# Patient Record
Sex: Male | Born: 1960 | Race: Black or African American | Hispanic: No | Marital: Married | Smoking: Never smoker
Health system: Southern US, Community
[De-identification: ages and names within clinical notes are randomized; demographics above are authoritative.]

## PROBLEM LIST (undated history)

## (undated) DIAGNOSIS — F419 Anxiety disorder, unspecified: Secondary | ICD-10-CM

## (undated) DIAGNOSIS — I10 Essential (primary) hypertension: Secondary | ICD-10-CM

## (undated) DIAGNOSIS — H269 Unspecified cataract: Secondary | ICD-10-CM

## (undated) DIAGNOSIS — J449 Chronic obstructive pulmonary disease, unspecified: Secondary | ICD-10-CM

## (undated) DIAGNOSIS — G4733 Obstructive sleep apnea (adult) (pediatric): Secondary | ICD-10-CM

## (undated) DIAGNOSIS — K219 Gastro-esophageal reflux disease without esophagitis: Secondary | ICD-10-CM

## (undated) DIAGNOSIS — Z9989 Dependence on other enabling machines and devices: Principal | ICD-10-CM

## (undated) DIAGNOSIS — E119 Type 2 diabetes mellitus without complications: Secondary | ICD-10-CM

## (undated) DIAGNOSIS — G4730 Sleep apnea, unspecified: Secondary | ICD-10-CM

## (undated) DIAGNOSIS — E785 Hyperlipidemia, unspecified: Secondary | ICD-10-CM

## (undated) HISTORY — DX: Hyperlipidemia, unspecified: E78.5

## (undated) HISTORY — DX: Essential (primary) hypertension: I10

## (undated) HISTORY — PX: CARPAL TUNNEL RELEASE: SHX101

## (undated) HISTORY — DX: Gastro-esophageal reflux disease without esophagitis: K21.9

## (undated) HISTORY — PX: HERNIA REPAIR: SHX51

## (undated) HISTORY — DX: Dependence on other enabling machines and devices: Z99.89

## (undated) HISTORY — DX: Obstructive sleep apnea (adult) (pediatric): G47.33

## (undated) HISTORY — DX: Unspecified cataract: H26.9

## (undated) HISTORY — DX: Chronic obstructive pulmonary disease, unspecified: J44.9

## (undated) HISTORY — DX: Sleep apnea, unspecified: G47.30

## (undated) HISTORY — DX: Anxiety disorder, unspecified: F41.9

## (undated) HISTORY — DX: Type 2 diabetes mellitus without complications: E11.9

---

## 1993-01-15 HISTORY — PX: REPAIR ANKLE LIGAMENT: SUR1187

## 2006-06-09 ENCOUNTER — Emergency Department (HOSPITAL_COMMUNITY): Admission: EM | Admit: 2006-06-09 | Discharge: 2006-06-09 | Payer: Self-pay | Admitting: Emergency Medicine

## 2006-07-08 ENCOUNTER — Ambulatory Visit: Payer: Self-pay | Admitting: Internal Medicine

## 2006-07-16 ENCOUNTER — Ambulatory Visit (HOSPITAL_COMMUNITY): Admission: RE | Admit: 2006-07-16 | Discharge: 2006-07-16 | Payer: Self-pay | Admitting: Internal Medicine

## 2006-08-22 ENCOUNTER — Ambulatory Visit: Payer: Self-pay | Admitting: *Deleted

## 2006-08-22 ENCOUNTER — Ambulatory Visit: Payer: Self-pay | Admitting: Internal Medicine

## 2006-09-18 ENCOUNTER — Ambulatory Visit: Payer: Self-pay | Admitting: Internal Medicine

## 2006-09-19 ENCOUNTER — Ambulatory Visit (HOSPITAL_COMMUNITY): Admission: RE | Admit: 2006-09-19 | Discharge: 2006-09-19 | Payer: Self-pay | Admitting: Internal Medicine

## 2006-10-02 ENCOUNTER — Emergency Department (HOSPITAL_COMMUNITY): Admission: EM | Admit: 2006-10-02 | Discharge: 2006-10-02 | Payer: Self-pay | Admitting: Emergency Medicine

## 2006-10-24 ENCOUNTER — Ambulatory Visit (HOSPITAL_BASED_OUTPATIENT_CLINIC_OR_DEPARTMENT_OTHER): Admission: RE | Admit: 2006-10-24 | Discharge: 2006-10-24 | Payer: Self-pay | Admitting: Internal Medicine

## 2006-10-25 ENCOUNTER — Ambulatory Visit: Payer: Self-pay | Admitting: Internal Medicine

## 2006-12-04 ENCOUNTER — Ambulatory Visit: Payer: Self-pay | Admitting: Internal Medicine

## 2006-12-20 ENCOUNTER — Ambulatory Visit: Payer: Self-pay | Admitting: Internal Medicine

## 2007-01-23 ENCOUNTER — Ambulatory Visit (HOSPITAL_BASED_OUTPATIENT_CLINIC_OR_DEPARTMENT_OTHER): Admission: RE | Admit: 2007-01-23 | Discharge: 2007-01-23 | Payer: Self-pay | Admitting: Internal Medicine

## 2007-01-23 ENCOUNTER — Ambulatory Visit: Payer: Self-pay | Admitting: Internal Medicine

## 2007-01-25 ENCOUNTER — Ambulatory Visit: Payer: Self-pay | Admitting: Internal Medicine

## 2007-02-01 ENCOUNTER — Emergency Department (HOSPITAL_COMMUNITY): Admission: EM | Admit: 2007-02-01 | Discharge: 2007-02-01 | Payer: Self-pay | Admitting: Emergency Medicine

## 2007-03-05 ENCOUNTER — Ambulatory Visit: Payer: Self-pay | Admitting: Internal Medicine

## 2007-04-23 ENCOUNTER — Ambulatory Visit: Payer: Self-pay | Admitting: Internal Medicine

## 2007-06-17 ENCOUNTER — Emergency Department (HOSPITAL_COMMUNITY): Admission: EM | Admit: 2007-06-17 | Discharge: 2007-06-17 | Payer: Self-pay | Admitting: Emergency Medicine

## 2007-06-26 ENCOUNTER — Ambulatory Visit: Payer: Self-pay | Admitting: Internal Medicine

## 2007-08-16 ENCOUNTER — Emergency Department (HOSPITAL_COMMUNITY): Admission: EM | Admit: 2007-08-16 | Discharge: 2007-08-16 | Payer: Self-pay | Admitting: Emergency Medicine

## 2007-09-10 ENCOUNTER — Ambulatory Visit: Payer: Self-pay | Admitting: Internal Medicine

## 2007-10-27 ENCOUNTER — Emergency Department (HOSPITAL_COMMUNITY): Admission: EM | Admit: 2007-10-27 | Discharge: 2007-10-27 | Payer: Self-pay | Admitting: Emergency Medicine

## 2007-11-26 ENCOUNTER — Ambulatory Visit: Payer: Self-pay | Admitting: Internal Medicine

## 2008-01-21 ENCOUNTER — Ambulatory Visit: Payer: Self-pay | Admitting: Internal Medicine

## 2008-04-22 ENCOUNTER — Ambulatory Visit: Payer: Self-pay | Admitting: Internal Medicine

## 2008-07-22 ENCOUNTER — Ambulatory Visit: Payer: Self-pay | Admitting: Internal Medicine

## 2008-07-22 LAB — CONVERTED CEMR LAB
ALT: 51 units/L (ref 0–53)
AST: 24 units/L (ref 0–37)
Chloride: 100 meq/L (ref 96–112)
Cholesterol: 181 mg/dL (ref 0–200)
Creatinine, Ser: 0.89 mg/dL (ref 0.40–1.50)
HDL: 66 mg/dL (ref >39)
LDL Cholesterol: 100 mg/dL — ABNORMAL HIGH (ref 0–99)
TSH: 0.977 microintl units/mL (ref 0.350–4.500)
Total Bilirubin: 1.4 mg/dL — ABNORMAL HIGH (ref 0.3–1.2)
Total CHOL/HDL Ratio: 2.7
Total Protein: 7.6 g/dL (ref 6.0–8.3)
Triglycerides: 75 mg/dL (ref <150)
VLDL: 15 mg/dL (ref 0–40)

## 2008-08-19 ENCOUNTER — Encounter: Admission: RE | Admit: 2008-08-19 | Discharge: 2008-11-17 | Payer: Self-pay | Admitting: Internal Medicine

## 2008-10-21 ENCOUNTER — Ambulatory Visit: Payer: Self-pay | Admitting: Internal Medicine

## 2009-01-19 ENCOUNTER — Ambulatory Visit: Payer: Self-pay | Admitting: Internal Medicine

## 2009-01-28 IMAGING — CR DG FEET 3 VIEWS BILAT
6 series · 6 of 6 positions shown · non-contrast
Comparison: ankle films 06/09/06.

CLINICAL DATA: Clinical Data:    Hand pain with limited gripping for two months.  Bilateral foot pain.  Right lower leg tendon surgical repair in 5717. 
RIGHT HAND ? 3 VIEWS:
No comparison.

[t foot ap left]
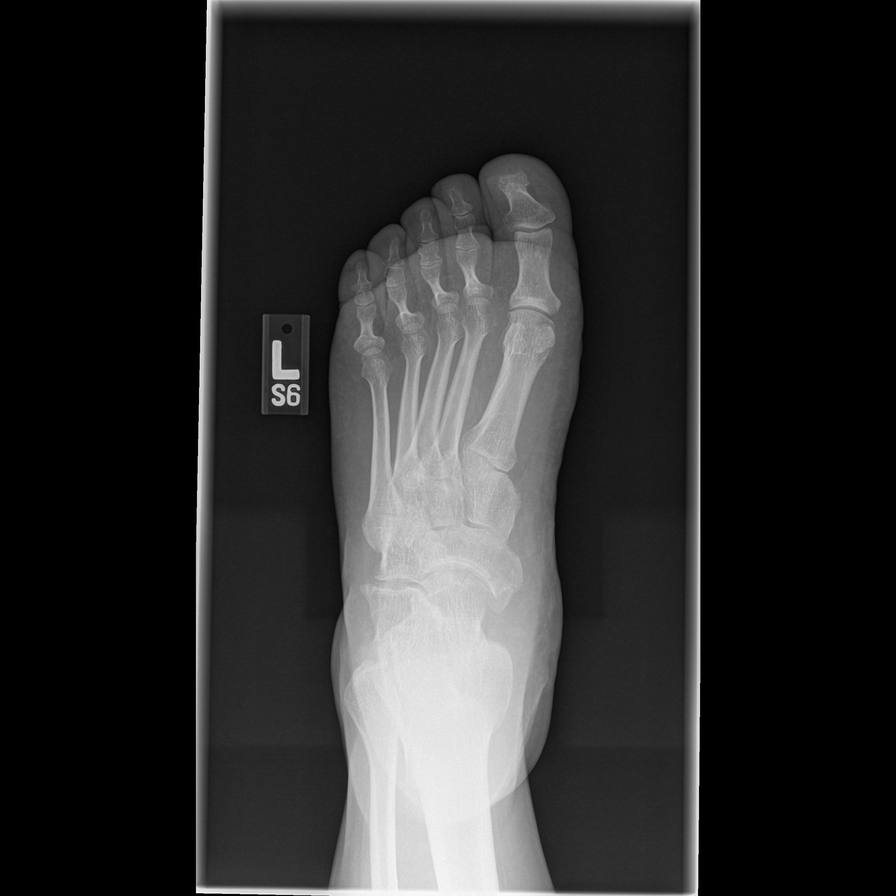

[t foot oblique left]
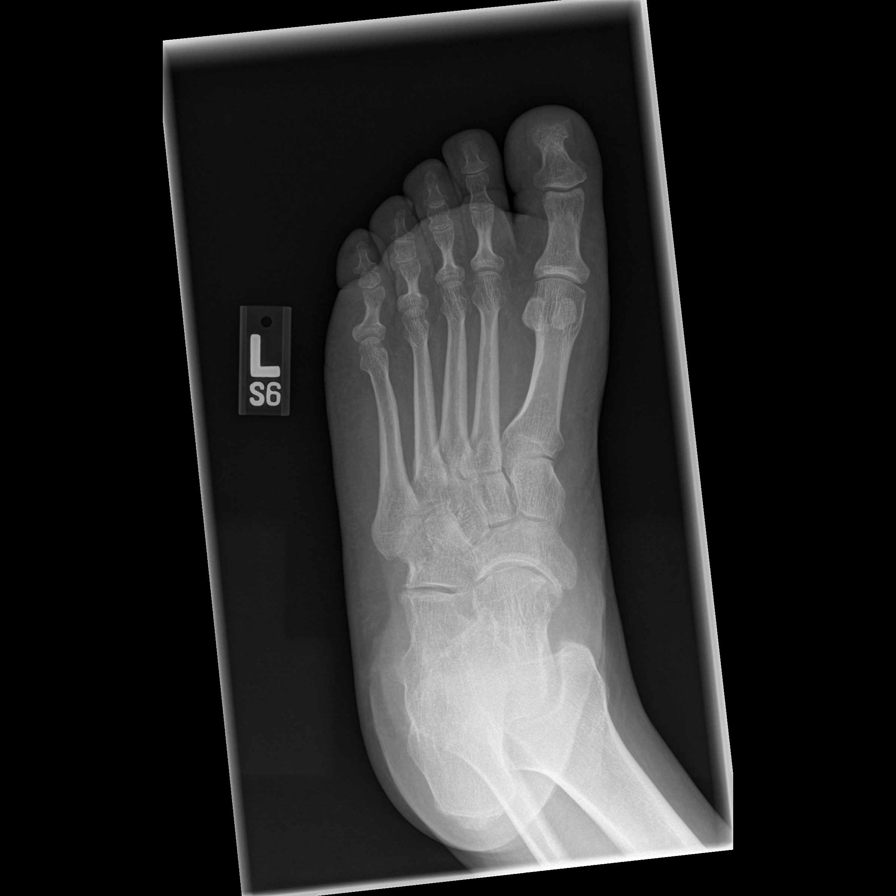

[t foot lat left]
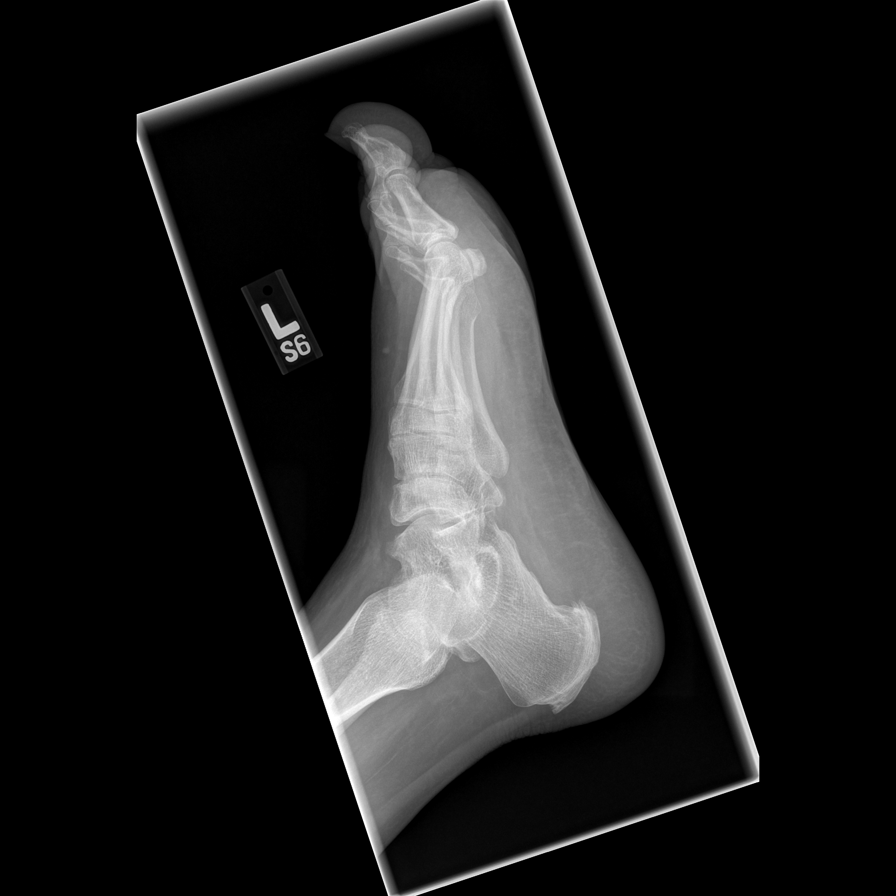

[t foot ap right]
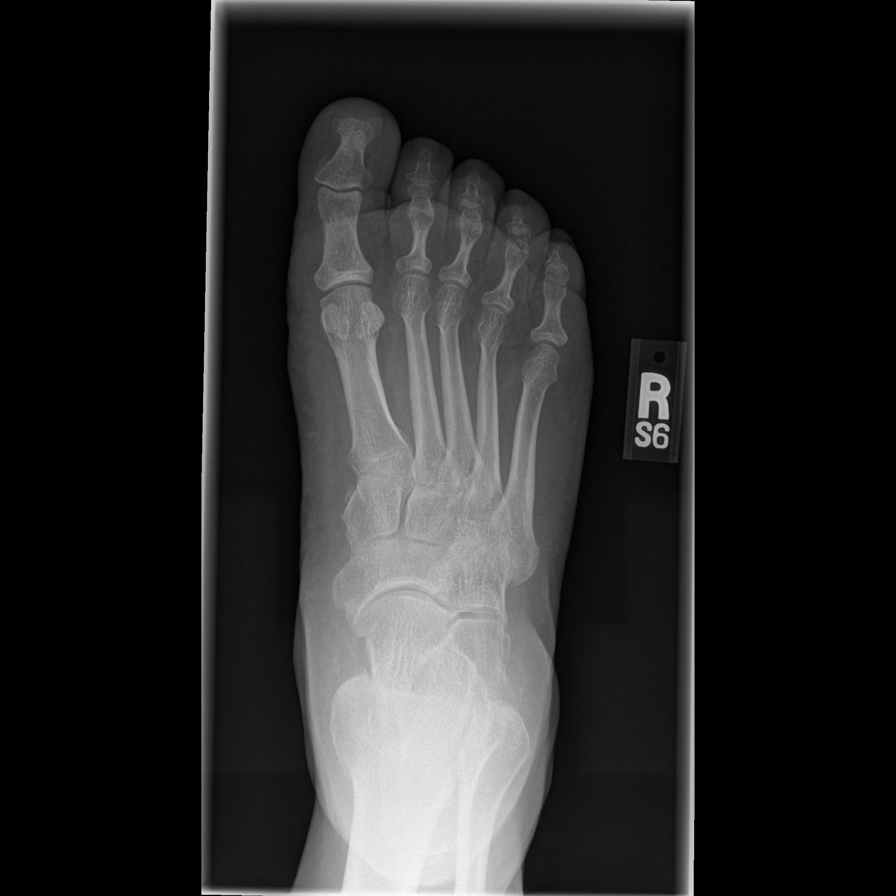

[t foot oblique right]
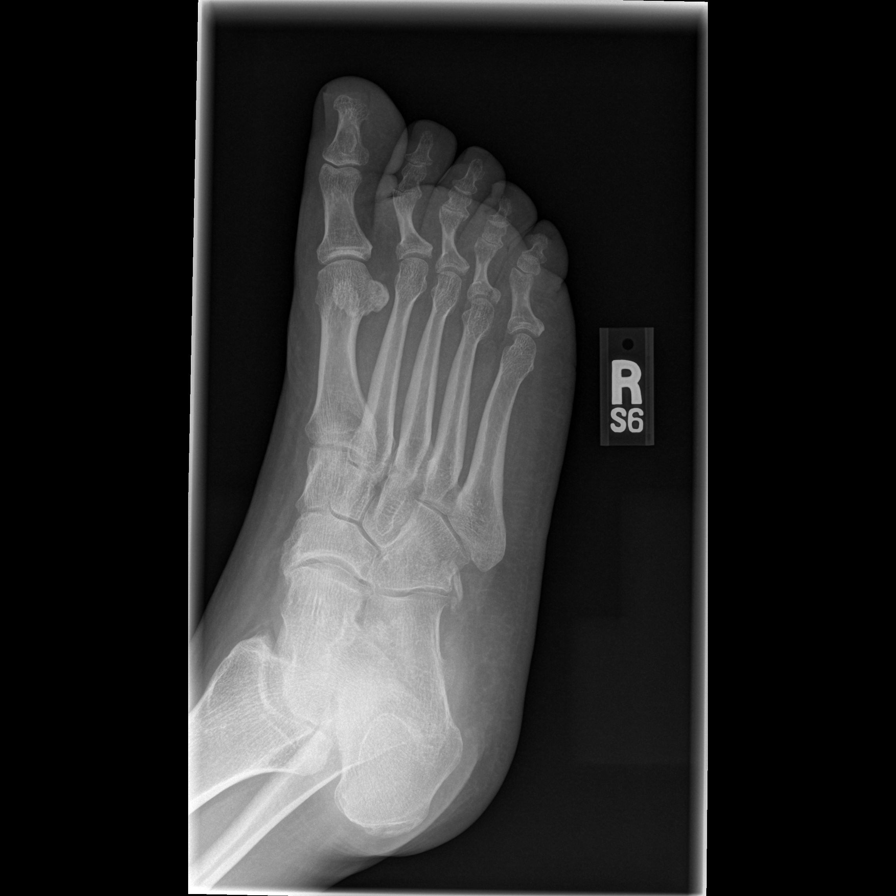

[t foot lat right]
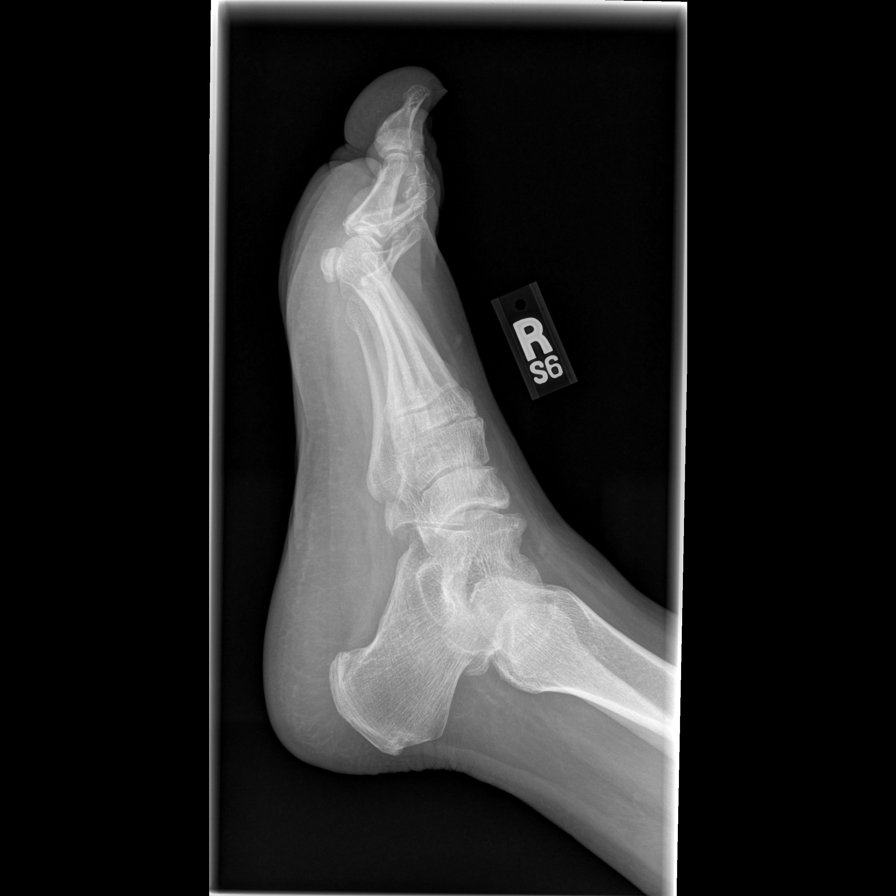

[6 of 6 positions shown; findings below may reference images not displayed]

FINDINGS: There is no evidence of fracture or dislocation. No other soft tissue or bone abnormalities are identified.
IMPRESSION: Negative.
LEFT HAND ? 3 VIEWS:
FINDINGS: There is no evidence of fracture or dislocation. No other soft tissue or bone abnormalities are identified.
IMPRESSION: Negative. 
LEFT FOOT ? 3 VIEWS:
FINDINGS: The soft tissues are generally prominent.  No focal swelling is evident.  There is no evidence of acute fracture or dislocation.  Calcaneocuboid degenerative changes are present with prominent spurring. There are small calcaneal spurs.
IMPRESSION: No acute osseous findings.  Asymmetric left foot calcaneocuboid degenerative change.
RIGHT FOOT ? 3 VIEWS:
FINDINGS: Soft tissue prominence appears diffuse and symmetric with respect to the left foot.  There is no evidence of acute fracture or dislocation.  Mild degenerative spurring and small calcaneal spurs appear stable.
IMPRESSION: No acute findings.

## 2009-01-28 IMAGING — CR DG HAND 3V BILAT
6 series · 6 of 6 positions shown · non-contrast
Comparison: ankle films 06/09/06.

CLINICAL DATA: Clinical Data:    Hand pain with limited gripping for two months.  Bilateral foot pain.  Right lower leg tendon surgical repair in 5717. 
RIGHT HAND ? 3 VIEWS:
No comparison.

[x hand pa left]
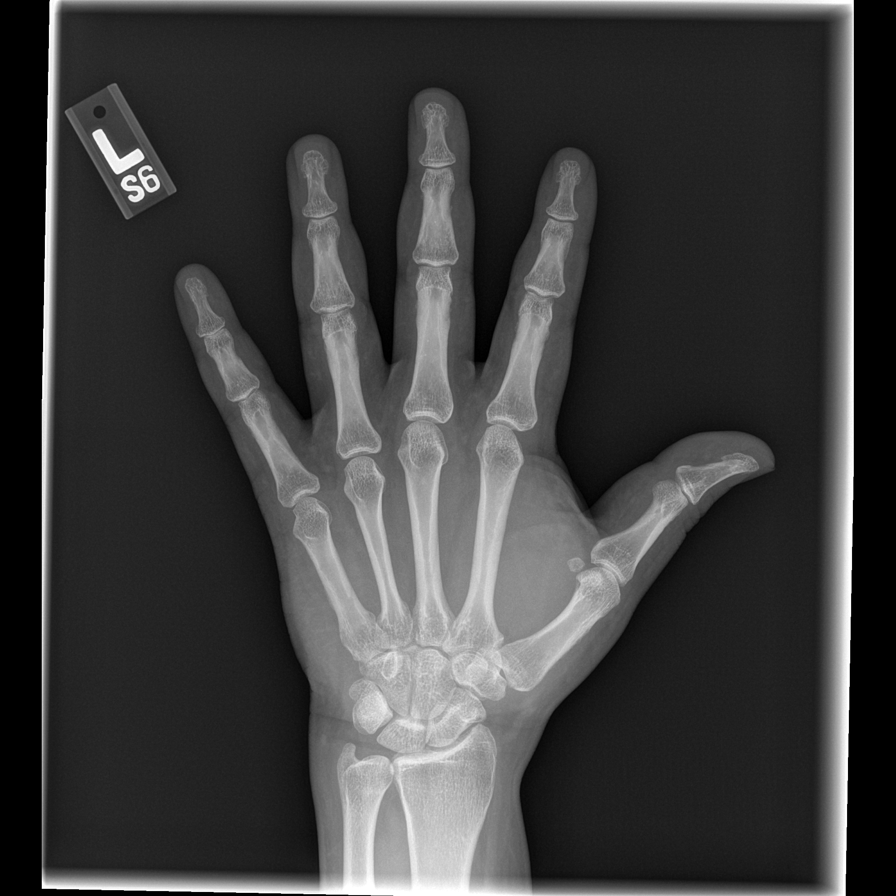

[x hand oblique left]
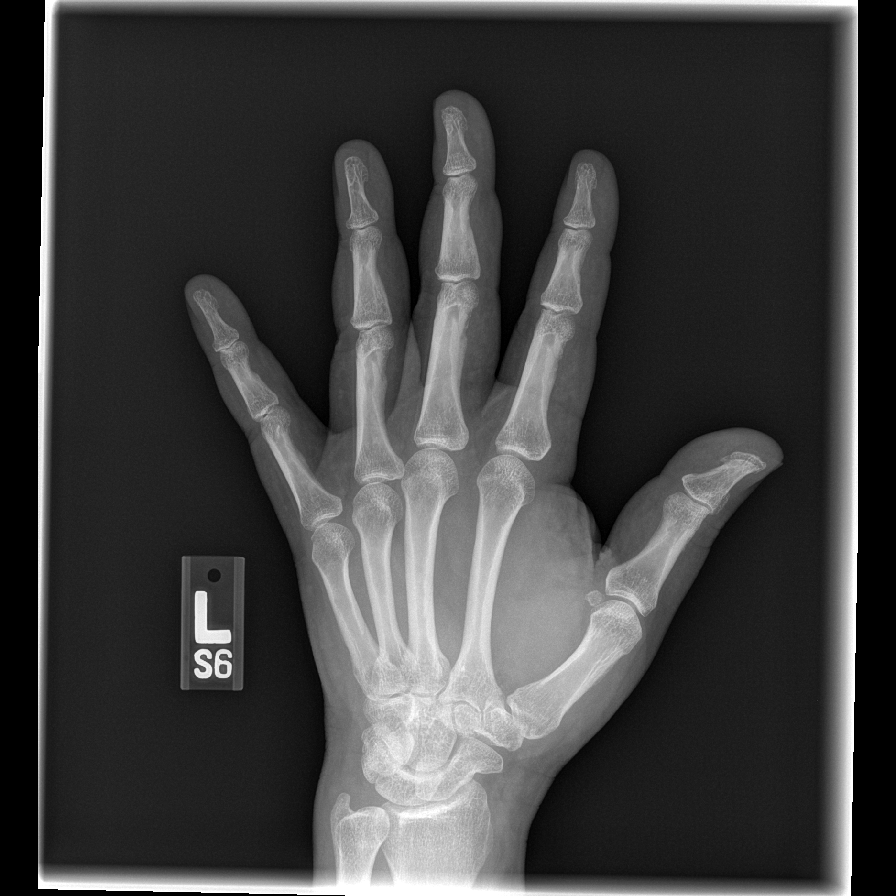

[x hand lat left]
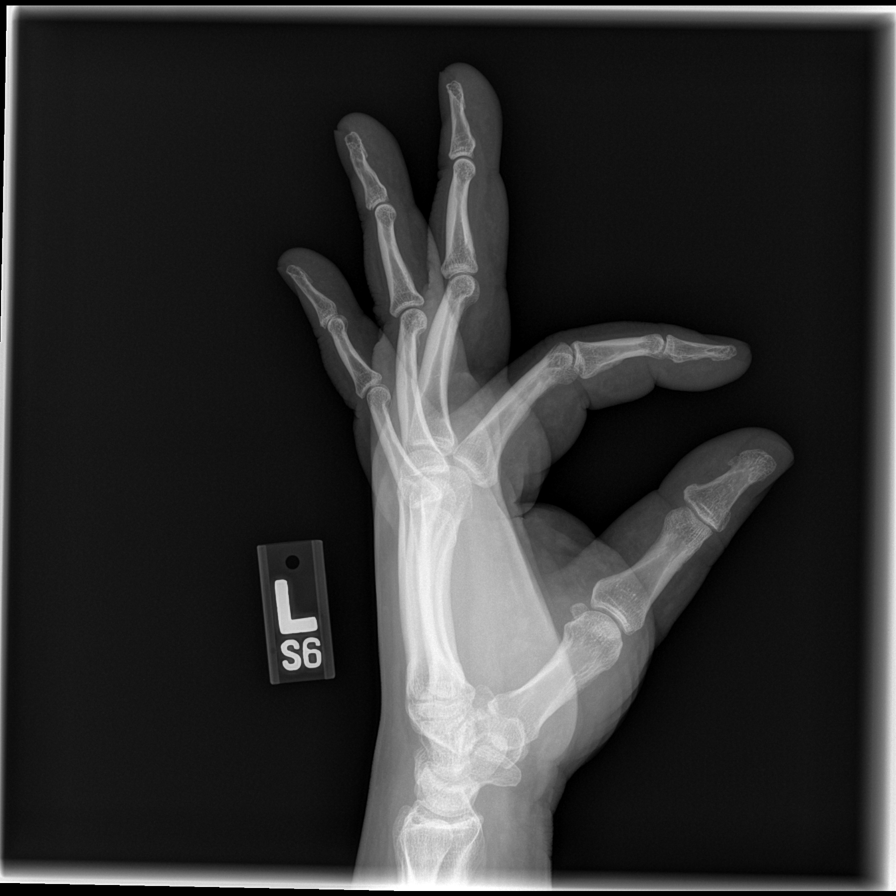

[x hand pa right]
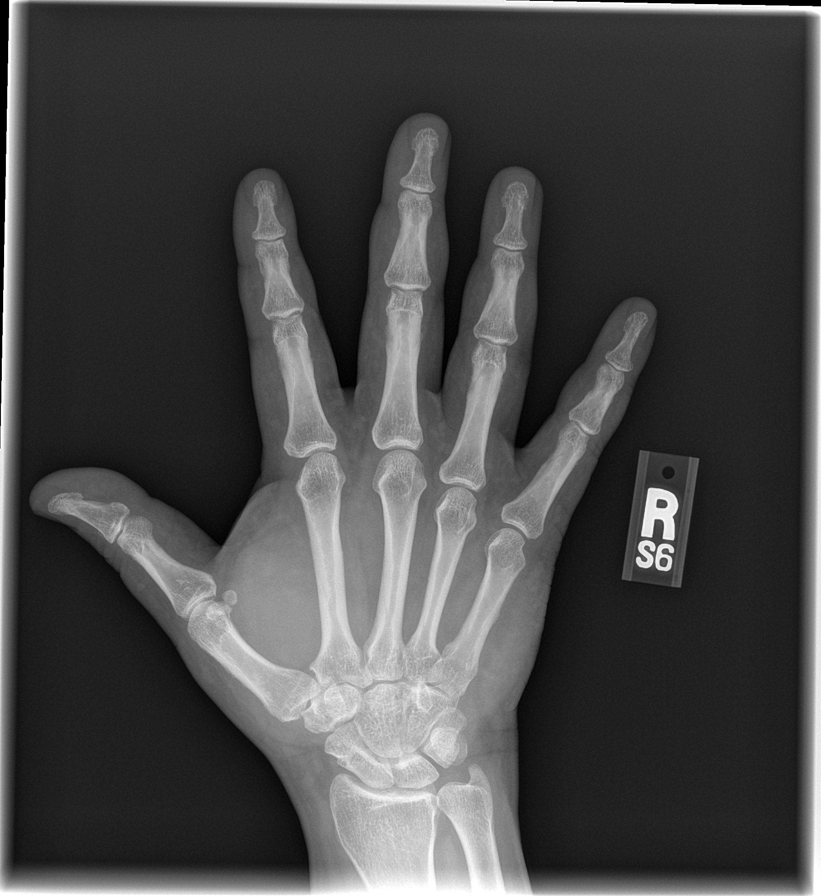

[x hand oblique right]
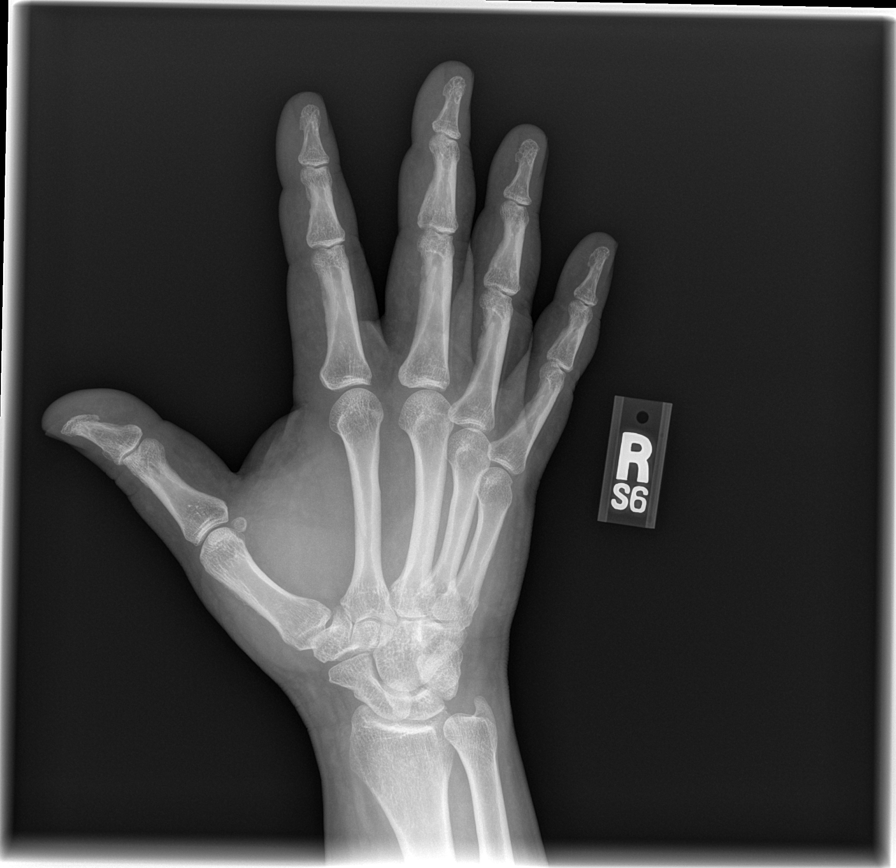

[x hand lat right]
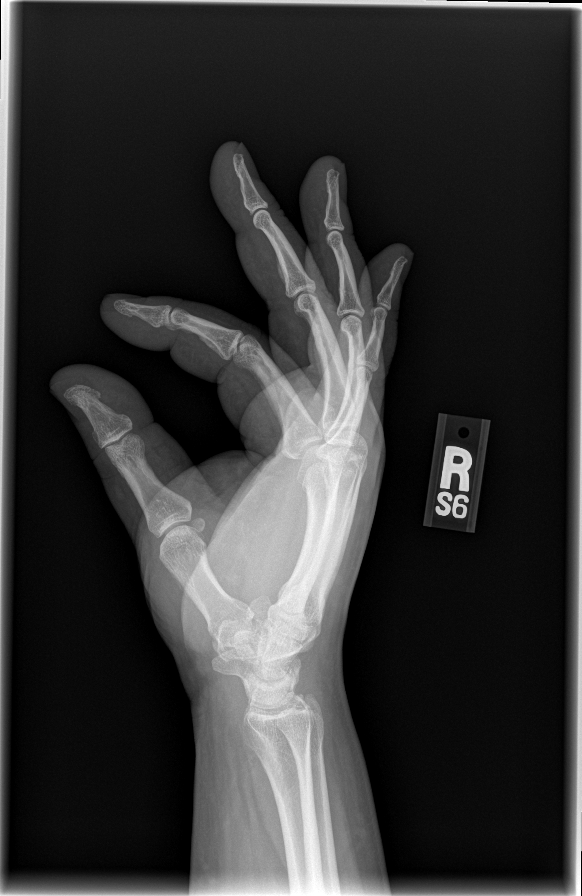

[6 of 6 positions shown; findings below may reference images not displayed]

FINDINGS: There is no evidence of fracture or dislocation. No other soft tissue or bone abnormalities are identified.
IMPRESSION: Negative.
LEFT HAND ? 3 VIEWS:
FINDINGS: There is no evidence of fracture or dislocation. No other soft tissue or bone abnormalities are identified.
IMPRESSION: Negative. 
LEFT FOOT ? 3 VIEWS:
FINDINGS: The soft tissues are generally prominent.  No focal swelling is evident.  There is no evidence of acute fracture or dislocation.  Calcaneocuboid degenerative changes are present with prominent spurring. There are small calcaneal spurs.
IMPRESSION: No acute osseous findings.  Asymmetric left foot calcaneocuboid degenerative change.
RIGHT FOOT ? 3 VIEWS:
FINDINGS: Soft tissue prominence appears diffuse and symmetric with respect to the left foot.  There is no evidence of acute fracture or dislocation.  Mild degenerative spurring and small calcaneal spurs appear stable.
IMPRESSION: No acute findings.

## 2009-02-22 ENCOUNTER — Ambulatory Visit: Payer: Self-pay | Admitting: Internal Medicine

## 2009-02-22 LAB — CONVERTED CEMR LAB: Microalb, Ur: 0.6 mg/dL (ref 0.00–1.89)

## 2009-05-17 ENCOUNTER — Ambulatory Visit: Payer: Self-pay | Admitting: Family Medicine

## 2009-07-08 ENCOUNTER — Ambulatory Visit: Payer: Self-pay | Admitting: Internal Medicine

## 2009-08-08 ENCOUNTER — Ambulatory Visit: Payer: Self-pay | Admitting: Internal Medicine

## 2009-08-31 ENCOUNTER — Ambulatory Visit: Payer: Self-pay | Admitting: Internal Medicine

## 2009-09-09 ENCOUNTER — Ambulatory Visit (HOSPITAL_COMMUNITY): Admission: RE | Admit: 2009-09-09 | Discharge: 2009-09-09 | Payer: Self-pay | Admitting: Orthopaedic Surgery

## 2009-09-26 ENCOUNTER — Ambulatory Visit: Payer: Self-pay | Admitting: Internal Medicine

## 2010-03-31 LAB — BASIC METABOLIC PANEL
BUN: 13 mg/dL (ref 6–23)
Calcium: 9.7 mg/dL (ref 8.4–10.5)
Chloride: 101 mEq/L (ref 96–112)
GFR calc Af Amer: >60 mL/min (ref >60)
Potassium: 3.9 mEq/L (ref 3.5–5.1)
Sodium: 141 mEq/L (ref 135–145)

## 2010-03-31 LAB — SURGICAL PCR SCREEN
MRSA, PCR: NEGATIVE
Staphylococcus aureus: NEGATIVE

## 2010-03-31 LAB — PROTIME-INR: INR: 1.03 (ref 0.00–1.49)

## 2010-05-30 NOTE — Procedures (Signed)
NAME:  Douglas Robinson, Douglas Robinson             ACCOUNT NO.:  [id number]   MEDICAL RECORD NO.:  [id number]          PATIENT TYPE:  OUT   LOCATION:  SLEEP CENTER                 FACILITY:  [facility]   PHYSICIAN:  Clinton D. Maple Hudson, MD, FCCP, FACPDATE OF BIRTH:  10/31/1960   DATE OF STUDY:  10/24/2006                            NOCTURNAL POLYSOMNOGRAM   REFERRING PHYSICIAN:  Dineen Kid. Reche Dixon, M.D.   INDICATIONS FOR PROCEDURE:  Hypersomnia with sleep apnea.   RESULTS:  Epward sleepiness score 14/24. BMI 33.6. Weight 235 pounds.  Neck size 18 inches. Height 5 feet 10 inches.   HOME MEDICATIONS:  Listed and reviewed.   A diagnostic NPSG protocol was requested.   SLEEP ARCHITECTURE:  Total sleep time 229 minutes with sleep efficiency  56%. Stage 1 was 49%; Stage 2, 40%; Stage 3, absent. REM 11% of total  sleep time. Sleep latency 20 minutes, REM latency 293 minutes. Awake  after sleep onset 174 minutes. Arousal index markedly increased at 64.1  per hour, indicating marked EEG arousal and sleep fragmentation. No  bedtime medication was taken.   RESPIRATORY DATA:  NPSG protocol. Apnea/hypopnea index (AHI, RDI) 78.2  obstructive events per hour, indicating severe obstructive sleep  apnea/hypopnea syndrome. There were 22 obstructive apneas and 277  hypopnea's. Events were not positional. REM AHI 112.   OXYGEN DATA:  Very loud snoring with oxygen desaturation to a nadir of  67%. Mean oxygen saturation through the study was 91% on room air.   CARDIAC DATA:  Sinus rhythm with occasional PVC.   MOVEMENT/PARASOMNIA:  No significant limb movement disturbance. Bathroom  x2.   IMPRESSION/RECOMMENDATIONS:  1. Severe obstructive sleep apnea/hypopnea syndrome, AHI 78.2 per hour      with non-positional events, very loud snoring, and oxygen      desaturation to a nadir of 67%.  2. Recommend early return for CPAP titration or evaluation for      alternative therapies as appropriate.  3. The patient indicated  sleep at home is also disturbed by occasional      reflux events and by leg pains.      Clinton D. Maple Hudson, MD, [facility], FACP  Diplomate, Biomedical engineer of Sleep Medicine  Electronically Signed     CDY/MEDQ  D:  10/27/2006 11:26:57  T:  10/27/2006 12:45:29  Job:  [id number]

## 2010-05-30 NOTE — Procedures (Signed)
NAME:  Douglas Robinson, Douglas Robinson             ACCOUNT NO.:  [id number]   MEDICAL RECORD NO.:  [id number]          PATIENT TYPE:  OUT   LOCATION:  SLEEP CENTER                 FACILITY[facility]  [facility]   PHYSICIAN:  Clinton D. Maple Hudson, MD, FCCP, FACPDATE OF BIRTH:  [birth year] 1958   DATE OF STUDY:  01/23/2007                            NOCTURNAL POLYSOMNOGRAM   REFERRING PHYSICIAN:  Dineen Kid. Reche Dixon, M.D.   INDICATION FOR STUDY:  Hypersomnia with sleep apnea.   Epworth sleepiness score:  14/24.  BMI:  33.7.  Weight:  235 pounds.  Height:  70 inches.  Neck:  18 inches.   HOME MEDICATIONS:  Charted and reviewed.   The diagnostic NPSG on  October 24, 2006 recorded an AHI of 78.2 per  hour.  CPAP titration is requested.   SLEEP ARCHITECTURE:  Total sleep time 350 minutes with sleep deficiency  80.1%.  Stage 1 was 8.3%.  Stage 2, 15.5%.  Stage 3, absent.  REM 41.2%  of total sleep time, suggesting REM rebound.  Sleep latency 7.5 minutes.  REM latency 84 minutes.  Awake after sleep onset 79 minutes.  Arousal  index 9.9.  No bedtime medication taken.   RESPIRATORY DATA:  CPAP titration protocol:  CPAP was titrated to 17  cwp, AHI 2.7 per hour.  A large ResMed Mirage Quattro mask was used with  heated humidifier.   OXYGEN DATA:  Snoring was prevented by CPAP and oxygen maintained at a  mean of 96.2% on room air while on CPAP.   CARDIAC DATA:  Normal sinus rhythm with multi-focal PVCs.   MOVEMENT/PARASOMNIA:  No significant movement disturbance.  Bathroom x1.   IMPRESSION/RECOMMENDATION:  1. Successful CPAP titration, 17 cwp, AHI 2.7 per hour.  A large      ResMed Mirage Quattro mask was used with      heated humidifier.  2. Baseline diagnostic NPSG on October 24, 2006 had recorded an AHI of      78.2 per hour.      Clinton D. Maple Hudson, MD, [facility], FACP  Diplomate, Biomedical engineer of Sleep Medicine  Electronically Signed     CDY/MEDQ  D:  01/25/2007 12:20:12  T:  01/25/2007 19:37:40  Job:  [id number]

## 2011-01-16 HISTORY — PX: POLYPECTOMY: SHX149

## 2011-01-16 HISTORY — PX: COLONOSCOPY: SHX174

## 2013-01-27 ENCOUNTER — Institutional Professional Consult (permissible substitution): Payer: Medicare PPO | Admitting: Neurology

## 2013-02-25 ENCOUNTER — Encounter: Payer: Self-pay | Admitting: Neurology

## 2013-02-25 ENCOUNTER — Ambulatory Visit (INDEPENDENT_AMBULATORY_CARE_PROVIDER_SITE_OTHER): Payer: Medicare PPO | Admitting: Neurology

## 2013-02-25 VITALS — BP 141/84 | HR 103 | Temp 97.1°F | Ht 70.5 in | Wt 288.0 lb

## 2013-02-25 DIAGNOSIS — M25579 Pain in unspecified ankle and joints of unspecified foot: Secondary | ICD-10-CM

## 2013-02-25 DIAGNOSIS — Z9989 Dependence on other enabling machines and devices: Principal | ICD-10-CM

## 2013-02-25 DIAGNOSIS — M25571 Pain in right ankle and joints of right foot: Secondary | ICD-10-CM

## 2013-02-25 DIAGNOSIS — G4733 Obstructive sleep apnea (adult) (pediatric): Secondary | ICD-10-CM | POA: Insufficient documentation

## 2013-02-25 DIAGNOSIS — G8929 Other chronic pain: Secondary | ICD-10-CM

## 2013-02-25 NOTE — Progress Notes (Signed)
Subjective:    Patient ID: Douglas Robinson is a 53 y.o. male.  HPI    Star Age, MD, PhD [facility] [address].O. [place], [place] [address]  Dear Dr. Brien Few,  I saw your patient, Douglas Robinson, upon your kind request in my neurologic clinic today for initial consultation of his sleep disorder, in particular his diagnosis of OSA. The patient is accompanied by his wife today. As you know, Douglas Robinson is a very pleasant 53 year old right-handed gentleman with an underlying medical history of HTN, HLP, DM, obesity, COPD (presumably from second hand smoking) and currently on chronic narcotic pain medication for therapeutic purposes of his R ankle pain, who was diagnosed with severe obstructive sleep apnea in October 2008. I reviewed his sleep study from 10/24/2006 read by Dr. Annamaria Boots: His sleep efficiency was reduced at 56%. He had an increased percentage of stage I sleep, absence of deep sleep, and a reduced percentage of REM sleep with a long REM latency. He had a markedly increased arousal index. His oveall AHI was elevated at 78.2 per hour, indicating severe obstructive sleep apnea. Very loud snoring was noted. His oxygen desaturation nadir was 67% and baseline was 91%. He had occasional PVCs. He had no significant periodic leg movements. He had a CPAP titration study on 01/23/2007 which was read by Dr. Annamaria Boots and I reviewed the test results: Sleep efficiency was 80.1%. He had absence of deep sleep, he had a markedly increased percentage of REM sleep at 41.2% with a normal REM latency. CPAP was titrated to a pressure of 17 cm. A large ResMed fullface mask was used. Average oxygen saturation was 96.2%. His AHI on the final pressure was 2.7 per hour. He had multifocal PVCs and no significant periodic leg movements of sleep. He injured his R ankle in the 80s and had surgery in 86, after which he continued to have severe pain in the R ankle, for which he is on  hydrocodone 10/325 mg in the daytime currently, but not at night at this moment. He has been on disability d/t chronic pain and his pain is much worse at night. He is hoping to be able to take his pain medication at night, to help him rest. He does not miss a day of CPAP. The patient has been using CPAP regularly. I reviewed his compliance data from his machine, which he brought in today: 11/17/12 to 02/24/13, which is the last 100 days during which time he used his machine every night. Average usage is 11 hours and 49 minutes. Percent of days greater than 4 hours of usage is 100%. He has an average AHI of 2.5 indicating an adequate pressure settings, which is currently at 13 cm. He feels that he is well treated. His sleep related complaints are pain at night, which causes him to sleep with disruption, but he uses his CPAP the moment he lays down and even during any naps.  His bedtime is around 10-11 PM and his wake time is around 6-7 AM and also uses it with naps, from 2-3 hours per day, after lunch. He has some anxiety, but is not on anything for it. He has been on Vistaril for sleep as he has thought racing and he had trouble going to sleep. Vistaril helps. He does not drink alcohol and he never smoked himself, but was exposed to heavy smoking growing up. He drinks caffeine rarely, 1-2 per month.   His Past Medical History Is  Significant For: Past Medical History  Diagnosis Date  . HTN (hypertension)   . Diabetes   . Hyperlipidemia   . Anxiety   . OSA on CPAP     His Past Surgical History Is Significant For: Past Surgical History  Procedure Laterality Date  . Carpal tunnel release    . Repair ankle ligament  1995    "Sometime before 1996"    His Family History Is Significant For: Family History  Problem Relation Age of Onset  . Cancer - Prostate Father     His Social History Is Significant For: History   Social History  . Marital Status: Married    Spouse Name: N/A    Number of  Children: N/A  . Years of Education: N/A   Social History Main Topics  . Smoking status: Never Smoker   . Smokeless tobacco: None  . Alcohol Use: No  . Drug Use: No  . Sexual Activity: None   Other Topics Concern  . None   Social History Narrative  . None    His Allergies Are:  Allergies  Allergen Reactions  . Naproxen Rash  :   His Current Medications Are:  Outpatient Encounter Prescriptions as of 02/25/2013  Medication Sig  . acarbose (PRECOSE) 50 MG tablet Take 50 mg by mouth 3 (three) times daily with meals.  Marland Kitchen albuterol (PROVENTIL HFA;VENTOLIN HFA) 108 (90 BASE) MCG/ACT inhaler Inhale into the lungs every 6 (six) hours as needed for wheezing or shortness of breath.  Marland Kitchen aspirin 81 MG tablet Take 81 mg by mouth daily.  . Famotidine (PEPCID AC PO) Take 1 tablet by mouth as needed.  . Fluticasone-Salmeterol (ADVAIR) 250-50 MCG/DOSE AEPB Inhale 1 puff into the lungs 2 (two) times daily.  Marland Kitchen glipiZIDE (GLUCOTROL) 10 MG tablet Take 10 mg by mouth daily before breakfast.  . hydrochlorothiazide (HYDRODIURIL) 25 MG tablet Take 25 mg by mouth daily.  Marland Kitchen HYDROcodone-acetaminophen (NORCO) 10-325 MG per tablet Take 1 tablet by mouth every 6 (six) hours as needed.  . hydrOXYzine (ATARAX/VISTARIL) 50 MG tablet Take 50 mg by mouth 3 (three) times daily as needed.  . irbesartan (AVAPRO) 300 MG tablet Take 300 mg by mouth daily.  . metFORMIN (GLUCOPHAGE) 1000 MG tablet Take 1,000 mg by mouth 2 (two) times daily with a meal.  . pantoprazole (PROTONIX) 20 MG tablet Take 20 mg by mouth daily.  . pravastatin (PRAVACHOL) 10 MG tablet Take 10 mg by mouth daily.  . sitaGLIPtin (JANUVIA) 100 MG tablet Take 100 mg by mouth daily.  :  Review of Systems:  Out of a complete 14 point review of systems, all are reviewed and negative with the exception of these symptoms as listed below: Review of Systems  HENT: Negative.   Eyes: Negative.   Respiratory: Positive for shortness of breath.    Cardiovascular: Positive for leg swelling.  Gastrointestinal: Negative.   Endocrine: Negative.   Genitourinary: Negative.   Musculoskeletal: Positive for arthralgias and joint swelling.  Skin: Negative.   Allergic/Immunologic: Positive for environmental allergies.  Neurological: Positive for weakness.  Hematological: Negative.   Psychiatric/Behavioral: Positive for sleep disturbance (snoring/apnea). The patient is nervous/anxious.     Objective:  Neurologic Exam  Physical Exam Physical Examination:   Filed Vitals:   02/25/13 0926  BP: 141/84  Pulse: 103  Temp: 97.1 F (36.2 C)    General Examination: The patient is a very pleasant 53 y.o. male in no acute distress. He appears well-developed and well-nourished and well  groomed. He is obese.   HEENT: Normocephalic, atraumatic, pupils are equal, round and reactive to light and accommodation. Funduscopic exam is normal with sharp disc margins noted. Extraocular tracking is good without limitation to gaze excursion or nystagmus noted. Normal smooth pursuit is noted. Hearing is grossly intact. Tympanic membranes are clear bilaterally. Face is symmetric with normal facial animation and normal facial sensation. Speech is clear with no dysarthria noted. There is no hypophonia. There is no lip, neck/head, jaw or voice tremor. Neck is supple with full range of passive and active motion. There are no carotid bruits on auscultation. Oropharynx exam reveals: mild mouth dryness, adequate dental hygiene and marked airway crowding, due to large tongue. Unfortunately I really could not fully visualize his airway because of sensitive gag and he appears to have a small airway entry. I could not visualize his tonsils either. Mallampati is class III. Tongue protrudes centrally and palate elevates symmetrically. Neck size is enlarged.    Chest: Clear to auscultation without wheezing, rhonchi or crackles noted.  Heart: S1+S2+0, regular and normal without  murmurs, rubs or gallops noted.   Abdomen: Soft, non-tender and non-distended with normal bowel sounds appreciated on auscultation.  Extremities: There is no pitting edema in the distal lower extremities bilaterally. Pedal pulses are intact. He has nonpitting puffiness around both ankles.  Skin: Warm and dry without trophic changes noted. There are no varicose veins.  Musculoskeletal: exam reveals no obvious joint deformities, tenderness or joint swelling or erythema, with the exception of a unremarkable bowel-shaped scar in the lateral right ankle. There is no swelling but he is tender and has limited range of motion in his right ankle. It does not appear to be fully fused.   Neurologically:  Mental status: The patient is awake, alert and oriented in all 4 spheres. His immediate and remote memory, attention, language skills and fund of knowledge are appropriate. There is no evidence of aphasia, agnosia, apraxia or anomia. Speech is clear with normal prosody and enunciation. Thought process is linear. Mood is normal and affect is normal.  Cranial nerves II - XII are as described above under HEENT exam. In addition: shoulder shrug is normal with equal shoulder height noted. Motor exam: Normal bulk, strength and tone is noted. There is no drift, tremor or rebound. Romberg is negative. Reflexes are 2+ throughout. Babinski: Toes are flexor bilaterally. Fine motor skills and coordination: intact with normal finger taps, normal hand movements, normal rapid alternating patting, normal foot taps and normal foot agility.  Cerebellar testing: No dysmetria or intention tremor on finger to nose testing. Heel to shin is unremarkable bilaterally. There is no truncal or gait ataxia.  Sensory exam: intact to light touch, pinprick, vibration, temperature sense and proprioception in the upper and lower extremities.  Gait, station and balance: He stands with difficulty d/t ankle pain. No veering to one side is noted.  No leaning to one side is noted. Posture is age-appropriate and stance is narrow based. Gait shows normal stride length and normal pace, but he has a slight limp on the right. No problems turning are noted. He turns in 2 steps. Tandem walk is not possible and he cannot do a toe or heel stance.                Assessment and Plan:   In summary, Douglas Robinson is a very pleasant 53 y.o.-year old male with an underlying medical history of HTN, HLP, DM, obesity, COPD (presumably from second hand smoking)  and currently on chronic narcotic pain medication for therapeutic purposes of his R ankle pain, who was diagnosed with severe obstructive sleep apnea in October 2008. I reviewed his baseline sleep study report as well as his CPAP titration report and his recent compliance data. The patient has indeed severe obstructive sleep apnea and appears to be well treated with a current pressure of 13 cm with 100% compliance reported as well as documented on his compliance data. His only sleep related complaints or difficulty achieving sleep which is improved with Vistaril and maintaining sleep due to pain at night. He is hoping to be able to take his pain medication at night as well after you have agreed to increase his pain medication to twice daily. From my end of things he should be able to continue with the current settings and his current machine. However, he has not had any recent supplies perhaps in the last 2 years. He is advised to check with his insurance which DME company wouldn't be in his network. After that, he is advised to call me back and I would be happy to write for CPAP related supplies. I can see him back yearly after this visit. He appears to be well treated and understands that he has to use CPAP anytime he sleeps and he currently does not. He is sleeping quite a bit during the day, and uses CPAP during his naps. The hope is that his sleep will be more consolidated at night if he is in less pain and  therefore he may not need to take along afternoon nap. If his insurance requires an updated sleep study we will have to pursue this. However, at this juncture, from my end of things, he does not need to have a sleep study or adjustment of his CPAP pressure. Again he is fully compliant and I will see him back in one year, if all goes well.  I answered all their questions today and the patient and his wife were in agreement.   Thank you very much for allowing me to participate in the care of this nice patient. If I can be of any further assistance to you please do not hesitate to call me at [phone].  Sincerely,   Star Age, MD, PhD

## 2013-02-25 NOTE — Patient Instructions (Signed)
Please continue using your CPAP regularly. While your insurance requires that you use CPAP at least 4 hours each night on 70% of the nights, I recommend, that you not skip any nights and use it throughout the night if you can. Getting used to CPAP does take time and patience and discipline. Untreated obstructive sleep apnea when it is moderate to severe can have an adverse impact on cardiovascular health and raise her risk for heart disease, arrhythmias, hypertension, congestive heart failure, stroke and diabetes. Untreated obstructive sleep apnea causes sleep disruption, nonrestorative sleep, and sleep deprivation. This can have an impact on your day to day functioning and cause daytime sleepiness and impairment of cognitive function, memory loss, mood disturbance, and problems focussing. Using CPAP regularly can improve these symptoms.  Keep up the good work! I will see you back in 12 months for sleep apnea check up, and so you can continue to get supplies.   I will send an order for CPAP supplies to a DME company of your choice, please remember to call us back regarding where to send your CPAP supply order, such as [employer], [employer]....etc

## 2013-02-26 ENCOUNTER — Other Ambulatory Visit: Payer: Self-pay | Admitting: Neurology

## 2013-02-26 ENCOUNTER — Telehealth: Payer: Self-pay | Admitting: Neurology

## 2013-02-26 DIAGNOSIS — G4733 Obstructive sleep apnea (adult) (pediatric): Secondary | ICD-10-CM

## 2013-02-26 DIAGNOSIS — Z9989 Dependence on other enabling machines and devices: Principal | ICD-10-CM

## 2013-02-26 NOTE — Telephone Encounter (Signed)
Patient's spouse called to give the name of the DME company that her husband uses for his CPAP. Patient use [name], and the wife is requesting an order for supplies be sent to [place]

## 2013-04-06 ENCOUNTER — Encounter: Payer: Self-pay | Admitting: Neurology

## 2013-04-07 ENCOUNTER — Telehealth: Payer: Self-pay | Admitting: Neurology

## 2013-04-07 NOTE — Telephone Encounter (Signed)
Patient waiting to hear back for CPAP supplies. Please call to advise.

## 2013-04-08 ENCOUNTER — Telehealth: Payer: Self-pay | Admitting: Neurology

## 2013-04-08 NOTE — Telephone Encounter (Signed)
I called and left a message for the patient to follow up on maybe speaking with [employer] on yesterday about the delivery of his CPAP. I asked the patient to callback to the office to speak with me Douglas Robinson).

## 2013-04-10 ENCOUNTER — Telehealth: Payer: Self-pay | Admitting: Neurology

## 2013-04-10 NOTE — Telephone Encounter (Signed)
I called back to patient's spouse after receiving a message to call. Douglas Robinson stated that [employer] had called and left a message concerning a package (CPAP machine) that they were unable to delivery due to incorrect address. I informed the patient's spouse that we have the [employer] in our system and that [name] was only going off the information that we sent to them. I informed the spouse that I will correct the address in the system and I gave her the number to [place] customer service.

## 2013-07-29 ENCOUNTER — Encounter: Payer: Self-pay | Admitting: *Deleted

## 2013-07-29 ENCOUNTER — Encounter: Payer: Medicare Other | Attending: Nurse Practitioner | Admitting: *Deleted

## 2013-07-29 NOTE — Patient Instructions (Addendum)
Plan:  Aim for 3-4 Carb Choices per meal (45-60 grams) +/- 1 either way  Aim for 0-15 Carbs per snack if hungry  Include protein in moderation with your meals and snacks Consider reading food labels for Total Carbohydrate and Fat Grams of foods Continue your activity level 30 minutes daily as tolerated Consider checking BG at alternate times to include first thing int he morning per day as directed by MD  Continue taking medication as directed by MD  Consider yogurt, Lite & fit, consider greek  [place] !!!  Contact physician and request order for glucometer and testing supplies so that he can resume testing.

## 2013-07-29 NOTE — Progress Notes (Signed)
Appt start time: 0800 end time:  0930.  Assessment:  Patient was seen on  07/29/13 for individual diabetes education. Douglas Robinson presents with his wife for DSME. Douglas Robinson is from [place] and he and his wife previously lived in [place]. They will occasionally return to [place] for vacation. At this time he indulges in his native food. They both do the shopping and cooking. He is on disability for chronic pain.  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  Discussed diabetes disease process and treatment options.  Discussed physiology of diabetes and role of obesity on insulin resistance.  Encouraged moderate weight reduction to improve glucose levels.  Discussed role of medications and diet in glucose control  Provided education on macronutrients on glucose levels.  Provided education on carb counting, importance of regularly scheduled meals/snacks, and meal planning  Discussed effects of physical activity on glucose levels and long-term glucose control.  Recommended 150 minutes of physical activity/week.  Reviewed patient medications.  Discussed role of medication on blood glucose and possible side effects  Discussed blood glucose monitoring and interpretation.  Discussed recommended target ranges and individual ranges.    Described short-term complications: hyper- and hypo-glycemia.  Discussed causes,symptoms, and treatment options.  Discussed prevention, detection, and  treatment of long-term complications.  Discussed the role of prolonged elevated glucose levels on body systems.  Discussed role of stress on blood glucose levels and discussed strategies to manage psychosocial issues.  Discussed recommendations for long-term diabetes self-care.  Established checklist for medical, dental, and emotional self-care.  Plan:  Aim for 3-4 Carb Choices per meal (45-60 grams) +/- 1 either way  Aim for 0-15 Carbs per snack if hungry  Include protein in moderation with your meals and snacks Consider reading food labels for Total Carbohydrate and Fat Grams of foods Continue your activity level 30 minutes daily as tolerated Consider checking BG at alternate times to include first thing int he morning per day as directed by MD  Continue taking medication as directed by MD  Consider yogurt, Lite & fit, consider greek [place] !!!  Contact physician and request order for glucometer and testing supplies so that he can resume testing.  Teaching Method Utilized:  Visual Auditory Hands on  Handouts given during visit include: Living Well with Diabetes Carb Counting and Food Label handouts Meal Plan Card Snack sheet My plate  Barriers to learning/adherence to lifestyle change: none noted  Diabetes self-care support plan:   [facility] support group  Family support  Demonstrated degree of understanding via:  Teach Back   Monitoring/Evaluation:  Dietary intake, exercise, test glucose, and body weight follow up prn.

## 2014-01-29 ENCOUNTER — Telehealth: Payer: Self-pay | Admitting: *Deleted

## 2014-01-29 NOTE — Telephone Encounter (Signed)
Patient canceled the appointment said he has to do a sleep study and his pcp scheduled him for one

## 2014-02-25 ENCOUNTER — Ambulatory Visit: Payer: Medicare PPO | Admitting: Neurology

## 2014-06-22 ENCOUNTER — Institutional Professional Consult (permissible substitution): Payer: Medicare HMO | Admitting: Internal Medicine

## 2014-10-11 ENCOUNTER — Telehealth: Payer: Self-pay | Admitting: Gastroenterology

## 2014-10-11 NOTE — Telephone Encounter (Signed)
Received GI records from [facility] and placed on Dr. Ardis Hughs desk for review.

## 2014-10-22 ENCOUNTER — Encounter: Payer: Self-pay | Admitting: Gastroenterology

## 2014-12-14 ENCOUNTER — Ambulatory Visit (AMBULATORY_SURGERY_CENTER): Payer: Self-pay | Admitting: *Deleted

## 2014-12-14 VITALS — Ht 70.0 in | Wt 280.0 lb

## 2014-12-14 DIAGNOSIS — Z86010 Personal history of colonic polyps: Secondary | ICD-10-CM

## 2014-12-14 MED ORDER — NA SULFATE-K SULFATE-MG SULF 17.5-3.13-1.6 GM/177ML PO SOLN: 1.0000 | Freq: Once | ORAL | Status: DC

## 2014-12-14 NOTE — Progress Notes (Signed)
No egg or soy allergy No issues with past sedation No diet pills No home 02 use   pt doesn't have e mail for the video

## 2014-12-21 ENCOUNTER — Telehealth: Payer: Self-pay | Admitting: Gastroenterology

## 2014-12-21 NOTE — Telephone Encounter (Signed)
Patient wife called in wanting to know if patient can use ventolin inhaler day of procedure.

## 2014-12-21 NOTE — Telephone Encounter (Signed)
Spoke with Butch Penny (wife) regarding the pt's inhaler(ventolin). The pt can use his inhaler the day of the procedure and to bring all other inhalers to his appointment to use as needed.She understood.

## 2014-12-27 ENCOUNTER — Ambulatory Visit (AMBULATORY_SURGERY_CENTER): Payer: Medicare HMO | Admitting: Gastroenterology

## 2014-12-27 ENCOUNTER — Encounter: Payer: Self-pay | Admitting: Gastroenterology

## 2014-12-27 VITALS — BP 117/61 | HR 99 | Temp 98.5°F | Resp 19 | Ht 70.0 in | Wt 280.0 lb

## 2014-12-27 DIAGNOSIS — D120 Benign neoplasm of cecum: Secondary | ICD-10-CM

## 2014-12-27 DIAGNOSIS — D125 Benign neoplasm of sigmoid colon: Secondary | ICD-10-CM

## 2014-12-27 DIAGNOSIS — K635 Polyp of colon: Secondary | ICD-10-CM

## 2014-12-27 DIAGNOSIS — Z86010 Personal history of colonic polyps: Secondary | ICD-10-CM | POA: Diagnosis not present

## 2014-12-27 LAB — GLUCOSE, CAPILLARY
GLUCOSE-CAPILLARY: 114 mg/dL — AB (ref 65–99)
Glucose-Capillary: 109 mg/dL — ABNORMAL HIGH (ref 65–99)

## 2014-12-27 MED ORDER — SODIUM CHLORIDE 0.9 % IV SOLN: 500.0000 mL | INTRAVENOUS | Status: DC

## 2014-12-27 NOTE — Progress Notes (Signed)
Called to room to assist during endoscopic procedure.  Patient ID and intended procedure confirmed with present staff. Received instructions for my participation in the procedure from the performing physician.  

## 2014-12-27 NOTE — Progress Notes (Signed)
To recovery, report to Myers, RN, VSS. 

## 2014-12-27 NOTE — Op Note (Addendum)
[place]  [address] [place], [address]   COLONOSCOPY PROCEDURE REPORT  PATIENT: Douglas Robinson, Douglas Robinson  MR#: [id number] BIRTHDATE: [birth year] , 46  yrs. old GENDER: male ENDOSCOPIST: Milus Banister, MD REFERRED BY: Juluis Mire, MD PROCEDURE DATE:  12/27/2014 PROCEDURE:   Colonoscopy, surveillance and Colonoscopy with snare polypectomy First Screening Colonoscopy - Avg.  risk and is 50 yrs.  old or older - No.  Prior Negative Screening - Now for repeat screening. N/A  History of Adenoma - Now for follow-up colonoscopy & has been > or = to 3 yrs.  Yes hx of adenoma.  Has been 3 or more years since last colonoscopy.  History of Adenoma - Now for follow-up colonoscopy & has been > or = to 3 yrs.  Polyps removed today? Yes ASA CLASS:   Class II INDICATIONS:Surveillance due to prior colonic neoplasia and Colonoscopy 2013 (elsewhere), 4 polyps removed some of them were TAs. MEDICATIONS: Monitored anesthesia care and Propofol 250 mg IV  DESCRIPTION OF PROCEDURE:   After the risks benefits and alternatives of the procedure were thoroughly explained, informed consent was obtained.  The digital rectal exam revealed no abnormalities of the rectum.   The LB [id number]  endoscope was introduced through the anus and advanced to the cecum, which was identified by both the appendix and ileocecal valve. No adverse events experienced.   The quality of the prep was excellent.  The instrument was then slowly withdrawn as the colon was fully examined. Estimated blood loss is zero unless otherwise noted in this procedure report.   COLON FINDINGS: Two sessile polyps ranging between 3-41mm in size were found in the sigmoid colon and at the cecum.  Polypectomies were performed with a cold snare.  The resection was complete, the polyp tissue was completely retrieved and sent to histology.   The examination was otherwise normal.  Retroflexed views revealed no abnormalities.  The time to cecum = 2.2 Withdrawal time = 6.5   The scope was withdrawn and the procedure completed. COMPLICATIONS: There were no immediate complications.  ENDOSCOPIC IMPRESSION: 1.   Two sessile polyps ranging between 3-72mm in size were found in the sigmoid colon and at the cecum; polypectomies were performed with a cold snare 2.   The examination was otherwise normal  RECOMMENDATIONS: If the polyp(s) removed today are proven to be adenomatous (pre-cancerous) polyps, you will need a repeat colonoscopy in 5 years.  You will receive a letter within 1-2 weeks with the results of your biopsy as well as final recommendations.  Please call my office if you have not received a letter after 3 weeks.  eSigned:  Milus Banister, MD 12/28/2014 7:43 AM Revised: 12/28/2014 7:43 AM

## 2014-12-27 NOTE — Patient Instructions (Signed)
YOU HAD AN ENDOSCOPIC PROCEDURE TODAY AT THE [facility]:   Refer to the procedure report that was given to you for any specific questions about what was found during the examination.  If the procedure report does not answer your questions, please call your gastroenterologist to clarify.  If you requested that your care partner not be given the details of your procedure findings, then the procedure report has been included in a sealed envelope for you to review at your convenience later.  YOU SHOULD EXPECT: Some feelings of bloating in the abdomen. Passage of more gas than usual.  Walking can help get rid of the air that was put into your GI tract during the procedure and reduce the bloating. If you had a lower endoscopy (such as a colonoscopy or flexible sigmoidoscopy) you may notice spotting of blood in your stool or on the toilet paper. If you underwent a bowel prep for your procedure, you may not have a normal bowel movement for a few days.  Please Note:  You might notice some irritation and congestion in your nose or some drainage.  This is from the oxygen used during your procedure.  There is no need for concern and it should clear up in a day or so.  SYMPTOMS TO REPORT IMMEDIATELY:   Following lower endoscopy (colonoscopy or flexible sigmoidoscopy):  Excessive amounts of blood in the stool  Significant tenderness or worsening of abdominal pains  Swelling of the abdomen that is new, acute  Fever of 100F or higher   For urgent or emergent issues, a gastroenterologist can be reached at any hour by calling [phone].   DIET: Your first meal following the procedure should be a small meal and then it is ok to progress to your normal diet. Heavy or fried foods are harder to digest and may make you feel nauseous or bloated.  Likewise, meals heavy in dairy and vegetables can increase bloating.  Drink plenty of fluids but you should avoid alcoholic beverages for 24  hours.  ACTIVITY:  You should plan to take it easy for the rest of today and you should NOT DRIVE or use heavy machinery until tomorrow (because of the sedation medicines used during the test).    FOLLOW UP: Our staff will call the number listed on your records the next business day following your procedure to check on you and address any questions or concerns that you may have regarding the information given to you following your procedure. If we do not reach you, we will leave a message.  However, if you are feeling well and you are not experiencing any problems, there is no need to return our call.  We will assume that you have returned to your regular daily activities without incident.  If any biopsies were taken you will be contacted by phone or by letter within the next 1-3 weeks.  Please call us at [phone] if you have not heard about the biopsies in 3 weeks.    SIGNATURES/CONFIDENTIALITY: You and/or your care partner have signed paperwork which will be entered into your electronic medical record.  These signatures attest to the fact that that the information above on your After Visit Summary has been reviewed and is understood.  Full responsibility of the confidentiality of this discharge information lies with you and/or your care-partner.  Next colonoscopy determined by pathology results. Please review polyp handout provided. 

## 2014-12-28 ENCOUNTER — Telehealth: Payer: Self-pay | Admitting: *Deleted

## 2014-12-28 NOTE — Telephone Encounter (Signed)
  Follow up Call-  Call back number 12/27/2014  Post procedure Call Back phone  # [phone]  Permission to leave phone message Yes     Patient questions:  Do you have a fever, pain , or abdominal swelling? No. Pain Score  0 *  Have you tolerated food without any problems? Yes.    Have you been able to return to your normal activities? Yes.    Do you have any questions about your discharge instructions: Diet   No. Medications  No. Follow up visit  No.  Do you have questions or concerns about your Care? No.  Actions: * If pain score is 4 or above: No action needed, pain <4.

## 2015-01-04 ENCOUNTER — Encounter: Payer: Self-pay | Admitting: Gastroenterology

## 2017-02-07 LAB — HM DIABETES EYE EXAM

## 2017-08-26 ENCOUNTER — Telehealth: Payer: Self-pay | Admitting: Internal Medicine

## 2017-08-26 NOTE — Telephone Encounter (Signed)
Copied from [place] [id number]. Topic: Appointment Scheduling - Scheduling Inquiry for Clinic >> Aug 26, 2017 11:21 AM Berneta Levins wrote: Reason for CRM:  Pt's wife [name], current pt of Dr. Sharlet Salina, calling:  This pt's PCP was Juluis Mire has left [facility].  Pt would like to establish with Dr. Pricilla Holm since this is who his wife sees.  Pt can be reached at [phone]

## 2017-08-27 NOTE — Telephone Encounter (Signed)
Fine, no more than 1 new patient daily

## 2017-08-27 NOTE — Telephone Encounter (Signed)
Patient scheduled for 8/15

## 2017-08-29 ENCOUNTER — Ambulatory Visit (INDEPENDENT_AMBULATORY_CARE_PROVIDER_SITE_OTHER): Payer: Medicare HMO | Admitting: Internal Medicine

## 2017-08-29 ENCOUNTER — Other Ambulatory Visit (INDEPENDENT_AMBULATORY_CARE_PROVIDER_SITE_OTHER): Payer: Medicare HMO

## 2017-08-29 VITALS — BP 122/80 | HR 116 | Temp 97.8°F | Ht 70.0 in | Wt 281.0 lb

## 2017-08-29 DIAGNOSIS — Z9989 Dependence on other enabling machines and devices: Secondary | ICD-10-CM

## 2017-08-29 DIAGNOSIS — E1169 Type 2 diabetes mellitus with other specified complication: Secondary | ICD-10-CM | POA: Insufficient documentation

## 2017-08-29 DIAGNOSIS — E785 Hyperlipidemia, unspecified: Secondary | ICD-10-CM | POA: Diagnosis not present

## 2017-08-29 DIAGNOSIS — I10 Essential (primary) hypertension: Secondary | ICD-10-CM | POA: Diagnosis not present

## 2017-08-29 DIAGNOSIS — M79671 Pain in right foot: Secondary | ICD-10-CM | POA: Diagnosis not present

## 2017-08-29 DIAGNOSIS — M79673 Pain in unspecified foot: Secondary | ICD-10-CM | POA: Insufficient documentation

## 2017-08-29 DIAGNOSIS — G4733 Obstructive sleep apnea (adult) (pediatric): Secondary | ICD-10-CM | POA: Diagnosis not present

## 2017-08-29 DIAGNOSIS — K219 Gastro-esophageal reflux disease without esophagitis: Secondary | ICD-10-CM

## 2017-08-29 DIAGNOSIS — M79672 Pain in left foot: Secondary | ICD-10-CM

## 2017-08-29 LAB — COMPREHENSIVE METABOLIC PANEL
ALBUMIN: 4.6 g/dL (ref 3.5–5.2)
ALT: 60 U/L — ABNORMAL HIGH (ref 0–53)
AST: 30 U/L (ref 0–37)
Alkaline Phosphatase: 64 U/L (ref 39–117)
BUN: 14 mg/dL (ref 6–23)
CHLORIDE: 99 meq/L (ref 96–112)
CO2: 29 meq/L (ref 19–32)
Calcium: 10.5 mg/dL (ref 8.4–10.5)
Creatinine, Ser: 0.81 mg/dL (ref 0.40–1.50)
GFR: 125.51 mL/min (ref >60.00)
GLUCOSE: 160 mg/dL — AB (ref 70–99)
Potassium: 3.9 mEq/L (ref 3.5–5.1)
SODIUM: 138 meq/L (ref 135–145)
Total Bilirubin: 1.7 mg/dL — ABNORMAL HIGH (ref 0.2–1.2)
Total Protein: 8 g/dL (ref 6.0–8.3)

## 2017-08-29 LAB — CBC
HCT: 44.6 % (ref 39.0–52.0)
Hemoglobin: 14.8 g/dL (ref 13.0–17.0)
MCHC: 33.2 g/dL (ref 30.0–36.0)
MCV: 87 fl (ref 78.0–100.0)
PLATELETS: 196 10*3/uL (ref 150.0–400.0)
RBC: 5.13 Mil/uL (ref 4.22–5.81)
RDW: 14.7 % (ref 11.5–15.5)
WBC: 8.5 10*3/uL (ref 4.0–10.5)

## 2017-08-29 LAB — LIPID PANEL
CHOL/HDL RATIO: 2
Cholesterol: 121 mg/dL (ref 0–200)
HDL: 50.5 mg/dL (ref >39.00)
LDL Cholesterol: 49 mg/dL (ref 0–99)
NonHDL: 70.99
TRIGLYCERIDES: 112 mg/dL (ref 0.0–149.0)
VLDL: 22.4 mg/dL (ref 0.0–40.0)

## 2017-08-29 LAB — HEMOGLOBIN A1C: HEMOGLOBIN A1C: 8.5 % — AB (ref 4.6–6.5)

## 2017-08-29 NOTE — Patient Instructions (Signed)
We will check the blood work today and may change the medicines if needed.

## 2017-08-29 NOTE — Assessment & Plan Note (Signed)
Taking protonix 20 mg daily and symptoms controlled.

## 2017-08-29 NOTE — Assessment & Plan Note (Addendum)
Taking glipizide 10 mg BID, metformin 1000 mg BID, januvia 100 mg daily, acarbose with meals. Suspect he may be overtreated with daily low sugars. Checking HgA1c and lipid panel and CMP and adjust as needed. Eye exam up to date. Foot exam done.

## 2017-08-29 NOTE — Progress Notes (Signed)
   Subjective:    Patient ID: Douglas Robinson, male    DOB: 1960/02/20, 57 y.o.   MRN: [id number]  HPI The patient is a 57 YO man coming in as new patient for continuation of medical care including his diabetes (usually runs pretty good, sugars are running to 60s often, more than 3 times per week, he is eating hard candies to get sugar up, taking metformin and acarbose and glipizide and januvia, on ARB and statin, complicated by hyperlipidemia) and GERD (taking protonix daily, this helps, denies breakthrough symptoms) and hypertension (taking hctz and irbesartan, denies dizziness or weakness, denies headaches, normally BP runs pretty good) and hyperlipidemia (taking pravastatin, denies muscle pains or aches, denies chest pains or stroke symptoms). Sees neurology for foot pain from past surgery and takes hydrocodone 10/325 BID during the summer and TID during the fall. Also OSA (on CPAP and needs new supplies).   PMH, [facility], social history reviewed and updated  Review of Systems  Constitutional: Positive for activity change. Negative for appetite change and fatigue.  HENT: Negative.   Eyes: Negative.   Respiratory: Negative for cough, chest tightness and shortness of breath.   Cardiovascular: Negative for chest pain, palpitations and leg swelling.  Gastrointestinal: Positive for abdominal distention. Negative for abdominal pain, constipation, diarrhea, nausea and vomiting.  Musculoskeletal: Positive for arthralgias and myalgias.  Skin: Negative.   Neurological: Negative.   Psychiatric/Behavioral: Positive for decreased concentration. Negative for agitation, behavioral problems, self-injury and sleep disturbance. The patient is not nervous/anxious and is not hyperactive.       Objective:   Physical Exam  Constitutional: He is oriented to person, place, and time. He appears well-developed and well-nourished.  Overweight  HENT:  Head: Normocephalic and atraumatic.  Eyes: EOM are normal.  Neck:  Normal range of motion.  Cardiovascular: Normal rate and regular rhythm.  Pulmonary/Chest: Effort normal and breath sounds normal. No respiratory distress. He has no wheezes. He has no rales.  Abdominal: Soft. Bowel sounds are normal. He exhibits no distension. There is no tenderness. There is no rebound.  Musculoskeletal: He exhibits tenderness. He exhibits no edema.  Neurological: He is alert and oriented to person, place, and time. Coordination normal.  Skin: Skin is warm and dry.  Psychiatric: He has a normal mood and affect.   Vitals:   08/29/17 0922  BP: 122/80  Pulse: (!) 116  Temp: 97.8 F (36.6 C)  TempSrc: Oral  SpO2: 98%  Weight: 281 lb (127.5 kg)  Height: 5\' 10"  (1.778 m)      Assessment & Plan:

## 2017-08-29 NOTE — Assessment & Plan Note (Signed)
Taking pravastatin 10 mg daily and checking lipid panel. Adjust for goal LDL <70.

## 2017-08-29 NOTE — Assessment & Plan Note (Signed)
Rx for CPAP supplies as he has old mask. Uses nightly when he has clean supplies.

## 2017-08-29 NOTE — Assessment & Plan Note (Signed)
BP at goal on hctz 25 mg daily, irbesartan 300 mg daily. Checking CMP for any CKD. Adjust as needed.

## 2017-08-29 NOTE — Assessment & Plan Note (Signed)
Seeing neurology and taking hydrocodone 10/325 BID during summer and TID during winter.

## 2017-08-30 ENCOUNTER — Other Ambulatory Visit: Payer: Self-pay | Admitting: Internal Medicine

## 2017-08-30 ENCOUNTER — Encounter: Payer: Self-pay | Admitting: Internal Medicine

## 2017-08-30 LAB — HEPATITIS C ANTIBODY

## 2017-08-30 MED ORDER — GLIPIZIDE ER 10 MG PO TB24: 10.0000 mg | ORAL_TABLET | Freq: Every day | ORAL | 1 refills | Status: DC

## 2017-08-30 NOTE — Progress Notes (Signed)
Abstracted and sent to scan  

## 2017-09-02 ENCOUNTER — Ambulatory Visit: Payer: Medicare HMO | Admitting: Physician Assistant

## 2017-09-19 ENCOUNTER — Telehealth: Payer: Self-pay

## 2017-09-19 NOTE — Telephone Encounter (Signed)
Patients wife informed of MD response and stated understanding  

## 2017-09-19 NOTE — Telephone Encounter (Signed)
Mrs jontrell bushong called with Mr. Barbato Glucose results   Before and after lunch  17th 112 96 18th 109 127 19th  79 76 20th 69 97 21st 67 126 22nd 66 86 23rd 95 127 24th 92 104 25th 53 117 26th 67 97 27th 72 75 28th 76 93 29th 81 122 30th 72 111 31st 60 118

## 2017-09-19 NOTE — Telephone Encounter (Signed)
These look good, no change to regimen.

## 2017-09-27 ENCOUNTER — Telehealth: Payer: Self-pay | Admitting: Internal Medicine

## 2017-09-27 NOTE — Telephone Encounter (Signed)
Patient & wife have dropped off forms to be completed for U.[place] Citizenship disability waiver. These forms have been completed by his past PCP in 2015. They have given a copy of the forms done in the past. Patient states some wording in the past forms need to be changed. It can not say bp "can" decrease, it needs to say "has lead". I have placed the forms in the box for you to look at and review. Please advise if you need me to complete.  LOV: 08/29/2017

## 2017-09-30 ENCOUNTER — Telehealth: Payer: Self-pay

## 2017-09-30 ENCOUNTER — Encounter: Payer: Self-pay | Admitting: Internal Medicine

## 2017-09-30 ENCOUNTER — Ambulatory Visit (INDEPENDENT_AMBULATORY_CARE_PROVIDER_SITE_OTHER): Payer: Medicare HMO | Admitting: Internal Medicine

## 2017-09-30 VITALS — BP 122/80 | HR 106 | Temp 97.8°F | Ht 70.0 in | Wt 277.0 lb

## 2017-09-30 DIAGNOSIS — M79671 Pain in right foot: Secondary | ICD-10-CM

## 2017-09-30 DIAGNOSIS — M79672 Pain in left foot: Secondary | ICD-10-CM | POA: Diagnosis not present

## 2017-09-30 DIAGNOSIS — R41840 Attention and concentration deficit: Secondary | ICD-10-CM | POA: Insufficient documentation

## 2017-09-30 DIAGNOSIS — G894 Chronic pain syndrome: Secondary | ICD-10-CM | POA: Diagnosis not present

## 2017-09-30 NOTE — Telephone Encounter (Signed)
Appointment has been made for 9/16.

## 2017-09-30 NOTE — Telephone Encounter (Signed)
Will need visit to discuss. None of the issues listed in last form have been addressed at all. He may need more testing to see if these old conditions still apply.

## 2017-09-30 NOTE — Progress Notes (Signed)
   Subjective:    Patient ID: Douglas Robinson, male    DOB: 12-22-1960, 57 y.o.   MRN: [id number]  HPI The patient is a 57 YO man coming in for some concerns. He has some medical exception forms from the government as he is trying to get citizenship. He has had these filled out in the past from a prior provider due to anxiety and depression. We have not talked about any of these problems or any problems with concentration or memory. He clarifies that the chronic pain is causing the focus problems as he is in pain all the time and unable to sit or stand for a long time. This has caused him anxiety and mild depression. He is still able to find enjoyment in activities. He is wanting to be a citizen but cannot sit still long enough to learn the information or take the exam. He was declared disabled in 2008 by the government due to his ankles and the chronic pain and lack of function.   Review of Systems  Constitutional: Negative.   HENT: Negative.   Eyes: Negative.   Respiratory: Negative for cough, chest tightness and shortness of breath.   Cardiovascular: Negative for chest pain, palpitations and leg swelling.  Gastrointestinal: Negative for abdominal distention, abdominal pain, constipation, diarrhea, nausea and vomiting.  Musculoskeletal: Positive for arthralgias and gait problem.  Skin: Negative.   Neurological: Negative for dizziness, weakness, light-headedness and numbness.  Psychiatric/Behavioral: Positive for decreased concentration and sleep disturbance. The patient is nervous/anxious.       Objective:   Physical Exam  Constitutional: He is oriented to person, place, and time. He appears well-developed and well-nourished.  HENT:  Head: Normocephalic and atraumatic.  Eyes: EOM are normal.  Neck: Normal range of motion.  Cardiovascular: Normal rate and regular rhythm.  Pulmonary/Chest: Effort normal and breath sounds normal. No respiratory distress. He has no wheezes. He has no rales.   Abdominal: Soft. Bowel sounds are normal. He exhibits no distension. There is no tenderness. There is no rebound.  Musculoskeletal: He exhibits tenderness. He exhibits no edema.  Neurological: He is alert and oriented to person, place, and time. Coordination normal.  Skin: Skin is warm and dry.  Psychiatric:  Wandering during the visit and hard to focus on one topic   Vitals:   09/30/17 1348  BP: 122/80  Pulse: (!) 106  Temp: 97.8 F (36.6 C)  TempSrc: Oral  SpO2: 94%  Weight: 277 lb (125.6 kg)  Height: 5\' 10"  (1.778 m)      Assessment & Plan:  Visit time 25 minutes: greater than 50% of that time was spent in face to face counseling and coordination of care with the patient: counseled about the importance of becoming a citizen as well as the course of his chronic pain and the way it has impacted his life.

## 2017-09-30 NOTE — Telephone Encounter (Signed)
Patient needs to sign one of the forms before they can be faxed. Patient informed and will be be in the office 10/01/2017 to sign form. Form is at my desk

## 2017-10-01 DIAGNOSIS — Z0279 Encounter for issue of other medical certificate: Secondary | ICD-10-CM

## 2017-10-01 NOTE — Assessment & Plan Note (Signed)
His PHQ-9 and GAD-7 were both high indicating mood disorder likely due to chronic pain. We talked about counseling or other options to see if this could help and he just wants to leave things the same as he still gets enjoyment out of life and is doing well. Clearly focus is impaired during the visit.

## 2017-10-01 NOTE — Assessment & Plan Note (Signed)
Seeing pain management and taking hydrocodone 2-3 times per day for pain and this is not doing well but is making the pain tolerable for him. Denies side effects at this time.

## 2017-10-01 NOTE — Telephone Encounter (Signed)
Forms have been sent to scan &charged for.

## 2017-10-01 NOTE — Assessment & Plan Note (Signed)
His chronic pain in his ankles and legs is causing a constant source of distraction for him through life and will fill out forms for him to see if he can be exempted from exam about history and civics.

## 2017-10-23 ENCOUNTER — Telehealth: Payer: Self-pay | Admitting: Internal Medicine

## 2017-10-23 MED ORDER — GLUCOSE BLOOD VI STRP: ORAL_STRIP | 1 refills | Status: DC

## 2017-10-23 NOTE — Telephone Encounter (Signed)
Rx sent to [facility]

## 2017-10-23 NOTE — Telephone Encounter (Signed)
Copied from [place] [id number]. Topic: Quick Communication - See Telephone Encounter >> Oct 23, 2017 10:06 AM Sheran Luz wrote: CRM for notification. See Telephone encounter for: 10/23/17.  Pts wife, Butch Penny, called requesting Dr. Sharlet Salina to write RX for accu chek aviva plus test strips for a 90 day supply if possible, stating pt uses them 2x daily.  Please advise.   Requesting RX faxed to [facility] at:  [phone]

## 2017-11-06 ENCOUNTER — Ambulatory Visit (INDEPENDENT_AMBULATORY_CARE_PROVIDER_SITE_OTHER): Payer: Medicare HMO | Admitting: Internal Medicine

## 2017-11-06 ENCOUNTER — Encounter: Payer: Self-pay | Admitting: Internal Medicine

## 2017-11-06 VITALS — BP 140/70 | HR 102 | Temp 97.8°F | Ht 70.0 in | Wt 275.0 lb

## 2017-11-06 DIAGNOSIS — Z23 Encounter for immunization: Secondary | ICD-10-CM | POA: Diagnosis not present

## 2017-11-06 DIAGNOSIS — G894 Chronic pain syndrome: Secondary | ICD-10-CM | POA: Diagnosis not present

## 2017-11-06 DIAGNOSIS — R41840 Attention and concentration deficit: Secondary | ICD-10-CM | POA: Diagnosis not present

## 2017-11-06 MED ORDER — ACARBOSE 50 MG PO TABS: 50.0000 mg | ORAL_TABLET | Freq: Three times a day (TID) | ORAL | 3 refills | Status: DC

## 2017-11-06 MED ORDER — IRBESARTAN 300 MG PO TABS: 300.0000 mg | ORAL_TABLET | Freq: Every day | ORAL | 3 refills | Status: DC

## 2017-11-06 MED ORDER — PRAVASTATIN SODIUM 10 MG PO TABS: 10.0000 mg | ORAL_TABLET | Freq: Every day | ORAL | 3 refills | Status: DC

## 2017-11-06 MED ORDER — ALBUTEROL SULFATE HFA 108 (90 BASE) MCG/ACT IN AERS: 1.0000 | INHALATION_SPRAY | Freq: Four times a day (QID) | RESPIRATORY_TRACT | 3 refills | Status: DC | PRN

## 2017-11-06 MED ORDER — PANTOPRAZOLE SODIUM 20 MG PO TBEC: 20.0000 mg | DELAYED_RELEASE_TABLET | Freq: Every day | ORAL | 3 refills | Status: DC

## 2017-11-06 MED ORDER — SITAGLIPTIN PHOSPHATE 100 MG PO TABS: 100.0000 mg | ORAL_TABLET | Freq: Every day | ORAL | 3 refills | Status: DC

## 2017-11-06 MED ORDER — GLIPIZIDE ER 10 MG PO TB24: 10.0000 mg | ORAL_TABLET | Freq: Every day | ORAL | 3 refills | Status: DC

## 2017-11-06 MED ORDER — HYDROCHLOROTHIAZIDE 25 MG PO TABS: 25.0000 mg | ORAL_TABLET | Freq: Every day | ORAL | 3 refills | Status: DC

## 2017-11-06 MED ORDER — TRAZODONE HCL 150 MG PO TABS: 150.0000 mg | ORAL_TABLET | Freq: Every day | ORAL | 3 refills | Status: DC

## 2017-11-06 MED ORDER — METFORMIN HCL 1000 MG PO TABS: 1000.0000 mg | ORAL_TABLET | Freq: Two times a day (BID) | ORAL | 3 refills | Status: DC

## 2017-11-06 NOTE — Progress Notes (Signed)
   Subjective:    Patient ID: Douglas Robinson, male    DOB: 1960-08-10, 57 y.o.   MRN: [id number]  HPI The patient is a 57 YO man coming in for concerns about a form filled out for citizen paperwork. He is on disability since 2008 due to chronic pain from ankles. He does have concentration problems due to pain and also anxiety due to chronic pain and health conditions. Denies change in condition from visit 1 month ago for same.   Review of Systems  Constitutional: Negative.   Respiratory: Negative for cough, chest tightness and shortness of breath.   Cardiovascular: Negative for chest pain, palpitations and leg swelling.  Gastrointestinal: Negative for abdominal distention, abdominal pain, constipation, diarrhea, nausea and vomiting.  Musculoskeletal: Positive for arthralgias, gait problem, joint swelling and myalgias.  Skin: Negative.   Neurological: Negative for dizziness, seizures, syncope, speech difficulty, weakness, light-headedness and headaches.  Psychiatric/Behavioral: Positive for decreased concentration, dysphoric mood and sleep disturbance. The patient is nervous/anxious.       Objective:   Physical Exam  Constitutional: He is oriented to person, place, and time. He appears well-developed and well-nourished.  Uncomfortable due to pain  HENT:  Head: Normocephalic and atraumatic.  Eyes: EOM are normal.  Neck: Normal range of motion.  Cardiovascular: Normal rate and regular rhythm.  Pulmonary/Chest: Effort normal and breath sounds normal. No respiratory distress. He has no wheezes. He has no rales.  Musculoskeletal: He exhibits tenderness. He exhibits no edema.  Neurological: He is alert and oriented to person, place, and time. Coordination normal.  Skin: Skin is warm and dry.  Psychiatric:  Anxious and in pain during visit   Vitals:   11/06/17 0755  BP: 140/70  Pulse: (!) 102  Temp: 97.8 F (36.6 C)  TempSrc: Oral  SpO2: 95%  Weight: 275 lb (124.7 kg)  Height: 5'  10" (1.778 m)      Assessment & Plan:  Tdap given at visit  Visit time 15 minutes: greater than 50% of that time was spent in face to face counseling and coordination of care with the patient: counseled about his concentration problems and need for editing for form

## 2017-11-06 NOTE — Assessment & Plan Note (Signed)
Still with same deficits and not able to take exam on civics or [place] history.

## 2017-11-06 NOTE — Assessment & Plan Note (Signed)
Stable and still on disability.

## 2017-12-20 ENCOUNTER — Ambulatory Visit (INDEPENDENT_AMBULATORY_CARE_PROVIDER_SITE_OTHER): Payer: Medicare HMO | Admitting: Internal Medicine

## 2017-12-20 ENCOUNTER — Encounter: Payer: Self-pay | Admitting: Internal Medicine

## 2017-12-20 ENCOUNTER — Other Ambulatory Visit (INDEPENDENT_AMBULATORY_CARE_PROVIDER_SITE_OTHER): Payer: Medicare HMO

## 2017-12-20 VITALS — BP 132/80 | HR 115 | Temp 98.2°F | Ht 70.0 in | Wt 275.0 lb

## 2017-12-20 DIAGNOSIS — E785 Hyperlipidemia, unspecified: Secondary | ICD-10-CM

## 2017-12-20 DIAGNOSIS — J0110 Acute frontal sinusitis, unspecified: Secondary | ICD-10-CM

## 2017-12-20 DIAGNOSIS — Z9989 Dependence on other enabling machines and devices: Secondary | ICD-10-CM

## 2017-12-20 DIAGNOSIS — R7989 Other specified abnormal findings of blood chemistry: Secondary | ICD-10-CM | POA: Insufficient documentation

## 2017-12-20 DIAGNOSIS — R945 Abnormal results of liver function studies: Secondary | ICD-10-CM | POA: Diagnosis not present

## 2017-12-20 DIAGNOSIS — E1169 Type 2 diabetes mellitus with other specified complication: Secondary | ICD-10-CM

## 2017-12-20 DIAGNOSIS — G4733 Obstructive sleep apnea (adult) (pediatric): Secondary | ICD-10-CM | POA: Diagnosis not present

## 2017-12-20 DIAGNOSIS — J329 Chronic sinusitis, unspecified: Secondary | ICD-10-CM | POA: Insufficient documentation

## 2017-12-20 LAB — COMPREHENSIVE METABOLIC PANEL
ALBUMIN: 4.4 g/dL (ref 3.5–5.2)
ALK PHOS: 61 U/L (ref 39–117)
ALT: 54 U/L — AB (ref 0–53)
AST: 23 U/L (ref 0–37)
BILIRUBIN TOTAL: 0.7 mg/dL (ref 0.2–1.2)
BUN: 17 mg/dL (ref 6–23)
CO2: 29 mEq/L (ref 19–32)
CREATININE: 0.69 mg/dL (ref 0.40–1.50)
Calcium: 9.7 mg/dL (ref 8.4–10.5)
Chloride: 101 mEq/L (ref 96–112)
GFR: 150.86 mL/min (ref >60.00)
GLUCOSE: 159 mg/dL — AB (ref 70–99)
POTASSIUM: 4.3 meq/L (ref 3.5–5.1)
SODIUM: 139 meq/L (ref 135–145)
TOTAL PROTEIN: 7.6 g/dL (ref 6.0–8.3)

## 2017-12-20 LAB — HEMOGLOBIN A1C: Hgb A1c MFr Bld: 6.9 % — ABNORMAL HIGH (ref 4.6–6.5)

## 2017-12-20 MED ORDER — AMOXICILLIN-POT CLAVULANATE 875-125 MG PO TABS: 1.0000 | ORAL_TABLET | Freq: Two times a day (BID) | ORAL | 0 refills | Status: DC

## 2017-12-20 NOTE — Progress Notes (Signed)
   Subjective:    Patient ID: Douglas Robinson, male    DOB: 12-05-1960, 57 y.o.   MRN: [id number]  HPI The patient is a 57 YO man coming in for follow up of abnormal LFT (increase in ALT last visit, has been working on diet, cannot exercise due to arthritis problems, denies alcohol usage), and diabetes (has not made change to long acting glipizide as he had some left over, taking acarbose and glipizide and metformin and januvia, does still have some low sugars which is why we decided to make the change, denies new numbness but chronic from back injury), and new sinus problems (started about 2-3 weeks ago, started with congestion, drainage, and cough, still coughing with intermittent chills, drainage which is mostly yellow, some headaches, no SOB, cough which is mostly non-productive, ear pressure, overall worsening, has been using theraflu which helps temporarily).   Review of Systems  Constitutional: Positive for activity change, appetite change, chills and fatigue. Negative for fever and unexpected weight change.  HENT: Positive for congestion, postnasal drip, rhinorrhea and sinus pressure. Negative for ear discharge, ear pain, sinus pain, sneezing, sore throat, tinnitus, trouble swallowing and voice change.   Eyes: Negative.   Respiratory: Positive for cough. Negative for chest tightness, shortness of breath and wheezing.   Cardiovascular: Negative.  Negative for chest pain, palpitations and leg swelling.  Gastrointestinal: Negative.  Negative for abdominal distention, abdominal pain, constipation, diarrhea, nausea and vomiting.  Musculoskeletal: Positive for arthralgias, back pain and gait problem.  Skin: Negative.   Neurological: Positive for numbness. Negative for dizziness, syncope, facial asymmetry and weakness.  Psychiatric/Behavioral: Negative.       Objective:   Physical Exam  Constitutional: He is oriented to person, place, and time. He appears well-developed and well-nourished.    HENT:  Head: Normocephalic and atraumatic.  Oropharynx with redness and clear drainage, nose with swollen turbinates, TMs normal bilaterally, frontal sinus pain on exam  Eyes: EOM are normal.  Neck: Normal range of motion. No thyromegaly present.  Cardiovascular: Normal rate and regular rhythm.  Pulmonary/Chest: Effort normal and breath sounds normal. No respiratory distress. He has no wheezes. He has no rales.  Abdominal: Soft. Bowel sounds are normal. He exhibits no distension. There is no tenderness. There is no rebound.  Musculoskeletal: He exhibits tenderness. He exhibits no edema.  Lymphadenopathy:    He has cervical adenopathy.  Neurological: He is alert and oriented to person, place, and time. Coordination normal.  Skin: Skin is warm and dry.  Psychiatric: He has a normal mood and affect.   Vitals:   12/20/17 0839  BP: 132/80  Pulse: (!) 115  Temp: 98.2 F (36.8 C)  TempSrc: Oral  SpO2: 92%  Weight: 275 lb (124.7 kg)  Height: 5\' 10"  (1.778 m)      Assessment & Plan:

## 2017-12-20 NOTE — Assessment & Plan Note (Signed)
Rechecking CMP today and adjust as needed. Denies alcohol usage. Appetite is down recently due to cold.

## 2017-12-20 NOTE — Assessment & Plan Note (Signed)
Rx for augmentin for infection and advised to start taking zyrtec to help. Would like to avoid steroids given uncontrolled diabetes.

## 2017-12-20 NOTE — Assessment & Plan Note (Signed)
Checking HgA1c and adjust as needed. Has not made recommended changes and is taking short acting glipizide, acarbose, januvia, metformin at this time. Adjust as needed.

## 2017-12-20 NOTE — Patient Instructions (Signed)
We have sent in augmentin for the sinus infection. Take 1 pill twice a day for 1 week.

## 2018-02-26 ENCOUNTER — Encounter: Payer: Self-pay | Admitting: Internal Medicine

## 2018-02-26 ENCOUNTER — Ambulatory Visit (INDEPENDENT_AMBULATORY_CARE_PROVIDER_SITE_OTHER): Payer: Medicare HMO | Admitting: Internal Medicine

## 2018-02-26 VITALS — BP 132/84 | HR 92 | Temp 97.8°F | Resp 18 | Ht 70.0 in | Wt 278.0 lb

## 2018-02-26 DIAGNOSIS — G4733 Obstructive sleep apnea (adult) (pediatric): Secondary | ICD-10-CM | POA: Diagnosis not present

## 2018-02-26 DIAGNOSIS — Z9989 Dependence on other enabling machines and devices: Secondary | ICD-10-CM | POA: Diagnosis not present

## 2018-02-26 DIAGNOSIS — F5101 Primary insomnia: Secondary | ICD-10-CM | POA: Diagnosis not present

## 2018-02-26 DIAGNOSIS — G4700 Insomnia, unspecified: Secondary | ICD-10-CM | POA: Insufficient documentation

## 2018-02-26 DIAGNOSIS — M25572 Pain in left ankle and joints of left foot: Secondary | ICD-10-CM | POA: Diagnosis not present

## 2018-02-26 MED ORDER — RAMELTEON 8 MG PO TABS: 8.0000 mg | ORAL_TABLET | Freq: Every day | ORAL | 3 refills | Status: DC

## 2018-02-26 NOTE — Progress Notes (Signed)
   Subjective:   Patient ID: Douglas Robinson, male    DOB: December 25, 1960, 58 y.o.   MRN: [id number]  HPI The patient is a 58 YO man coming in for several concerns including insomnia (having problems sleeping, using trazodone which stopped working, his pain medicine makes him drowsy but cannot fall asleep, has tried zzquil over the counter which helped for some time, now taking benadryl over the counter which does not work for him) OSA (on CPAP but his machine is 60 years old and not working well, he is sleepy during the day often) and left foot pain (chronic right foot pain and now having more pain on the left foot, some swelling in the ankle, denies injury, started weeks ago, denies taking anything for it, takes medication for right foot pain and this is not helping, some burning pains, some pain with walking, he is not moving around as much).   Review of Systems  Constitutional: Negative.   HENT: Negative.   Eyes: Negative.   Respiratory: Negative for cough, chest tightness and shortness of breath.   Cardiovascular: Negative for chest pain, palpitations and leg swelling.  Gastrointestinal: Negative for abdominal distention, abdominal pain, constipation, diarrhea, nausea and vomiting.  Musculoskeletal: Positive for arthralgias and myalgias.  Skin: Negative.   Neurological: Negative.   Psychiatric/Behavioral: Positive for sleep disturbance. Negative for behavioral problems, decreased concentration, dysphoric mood and hallucinations. The patient is not nervous/anxious and is not hyperactive.     Objective:  Physical Exam Constitutional:      Appearance: He is well-developed. He is obese.  HENT:     Head: Normocephalic and atraumatic.  Neck:     Musculoskeletal: Normal range of motion.  Cardiovascular:     Rate and Rhythm: Normal rate and regular rhythm.  Pulmonary:     Effort: Pulmonary effort is normal. No respiratory distress.     Breath sounds: Normal breath sounds. No wheezing or  rales.  Abdominal:     General: Bowel sounds are normal. There is no distension.     Palpations: Abdomen is soft.     Tenderness: There is no abdominal tenderness. There is no rebound.  Musculoskeletal:        General: Tenderness present.  Skin:    General: Skin is warm and dry.  Neurological:     Mental Status: He is alert and oriented to person, place, and time.     Coordination: Coordination normal.  Psychiatric:     Comments: Wandering historian     Vitals:   02/26/18 0800  BP: 132/84  Pulse: 92  Resp: 18  Temp: 97.8 F (36.6 C)  TempSrc: Oral  SpO2: 95%  Weight: 278 lb (126.1 kg)  Height: 5\' 10"  (1.778 m)    Assessment & Plan:

## 2018-02-26 NOTE — Assessment & Plan Note (Signed)
Ordered CPAP as he has OSA and needs replacement CPAP device. Informed them that since last sleep study was likely around 10 years ago they may need to update and if needed will order.

## 2018-02-26 NOTE — Assessment & Plan Note (Signed)
Rx for rozerem to try for sleep. Previously failed trazodone and zzquil and benadryl.

## 2018-02-26 NOTE — Patient Instructions (Addendum)
We have sent in a medicine called rozerem for sleep to try.   We have sent in the order for the new CPAP machine.   We will get you in with the foot specialist to help the left foot.

## 2018-02-26 NOTE — Assessment & Plan Note (Signed)
Referral to podiatry as this could be amenable to treatment.

## 2018-02-28 ENCOUNTER — Telehealth: Payer: Self-pay | Admitting: Internal Medicine

## 2018-02-28 NOTE — Telephone Encounter (Signed)
Copied from [place] [id number]. Topic: Quick Communication - See Telephone Encounter >> Feb 28, 2018  2:32 PM Sheran Luz wrote: CRM for notification. See Telephone encounter for: 02/28/18.  Patient's wife calling on behalf of patient stating that his insurance does not cover ramelteon (ROZEREM) 8 MG tablet and that it was originally sent to pharmacy that patient does not use; Wife inquired if an alternate medication can be sent to pharmacy on file. Please advise.   Preferred pharmacy: [facility], [place]  [phone] (Phone) [phone] (Fax)

## 2018-02-28 NOTE — Telephone Encounter (Signed)
PA started on CoverMyMeds KEY: [id number]

## 2018-03-03 ENCOUNTER — Other Ambulatory Visit: Payer: Self-pay

## 2018-03-03 MED ORDER — RAMELTEON 8 MG PO TABS: 8.0000 mg | ORAL_TABLET | Freq: Every day | ORAL | 3 refills | Status: DC

## 2018-03-03 NOTE — Telephone Encounter (Signed)
PA approved from 02/28/2018 12:00:00 AM, Coverage Ends on: 01/15/2019 12:00:00 AM

## 2018-03-03 NOTE — Telephone Encounter (Signed)
Patient informed PA has been approved and was resent to [place]. Patient will call [employer] to see if it is a decent price after PA

## 2018-03-11 ENCOUNTER — Ambulatory Visit: Payer: Self-pay

## 2018-03-11 NOTE — Telephone Encounter (Signed)
He should not be taking sleeping medication during the day. Recommend melatonin over the counter. There are not many other safe options with his pain medication.

## 2018-03-11 NOTE — Telephone Encounter (Signed)
Returned call to pt.  Reported he took one dose of Rozerem on Sunday, about 10:00  AM.  Explained that after taking the Rozerem, he got a "severe headache, dizziness, and hands were shaking.  Described that he "felt hung-over."  Reported the dizziness lasted all day.  Stated he felt better on Monday.  Denied any continued symptoms of dizziness, shaking of hands, or headache.       Questioned why he took the Rozerem in the daytime, instead of at bedtime?  Reported that he always takes a sleeping pill with his pain medication, to take the pain away.  Stated he doesn't want to take any more of the Rozerem.  Is requesting that Dr. Sharlet Salina order something in place of the Rozerem.  Advised will send note to Dr. Sharlet Salina, and request further recommendations.  Agreed with plan.   Reason for Disposition . Caller has NON-URGENT medication question about med that PCP prescribed and triager unable to answer question  Answer Assessment - Initial Assessment Questions 1. SYMPTOMS: "Do you have any symptoms?"     C/o side effect of dizziness, headache, and feeling hung over 2. SEVERITY: If symptoms are present, ask "Are they mild, moderate or severe?"    Severe headache in middle of night after taking the Rozerem; wakes up very dizzy and hung over.  Protocols used: MEDICATION QUESTION CALL-A-AH  Message from [facility] sent at 03/11/2018 9:44 AM EST   Pt wife stated the ramelteon (ROZEREM) 8 MG tablet is causing pt to have headaches and he would like to know if Dr. Sharlet Salina will prescribe a different medication. Cb# [phone]

## 2018-03-11 NOTE — Telephone Encounter (Signed)
Patients wife informed of MD response and stated understanding  

## 2018-03-31 ENCOUNTER — Telehealth: Payer: Self-pay | Admitting: Internal Medicine

## 2018-03-31 ENCOUNTER — Encounter: Payer: Self-pay | Admitting: Internal Medicine

## 2018-03-31 NOTE — Telephone Encounter (Signed)
This is an anti-psychotic medication and not really a sleep medication so I would not recommend just for sleep.

## 2018-03-31 NOTE — Telephone Encounter (Signed)
Patient's wife informed

## 2018-03-31 NOTE — Telephone Encounter (Signed)
Copied from [place] [id number]. Topic: Quick Communication - Rx Refill/Question >> Mar 31, 2018  9:09 AM Reyne Dumas L wrote: Medication: Seroquel   Pt's wife, Butch Penny, calling - states that they would like to know if this is an option for pt to take to help with sleep. Butch Penny can be reached at [phone]

## 2018-04-01 ENCOUNTER — Other Ambulatory Visit: Payer: Self-pay

## 2018-04-01 MED ORDER — GLUCOSE BLOOD VI STRP: ORAL_STRIP | 1 refills | Status: DC

## 2018-04-30 ENCOUNTER — Telehealth: Payer: Self-pay

## 2018-04-30 MED ORDER — MIRTAZAPINE 15 MG PO TABS: 15.0000 mg | ORAL_TABLET | Freq: Every day | ORAL | 0 refills | Status: DC

## 2018-04-30 NOTE — Telephone Encounter (Signed)
Have sent in remeron instead to try for sleep. Take 1 pill at night for sleep for 2 weeks and if still having problems increase to 2 pills at night time. It is not safe to take more than 2 pills at night time so do not increase further.

## 2018-04-30 NOTE — Telephone Encounter (Signed)
Patient wife informed of MD response and stated understanding

## 2018-04-30 NOTE — Telephone Encounter (Signed)
Copied from [place] [phone]. Topic: General - Other >> Apr 30, 2018  9:35 AM Antonieta Iba C wrote: Reason for CRM: Pt's spouse called in to suggest the sleep medication Doxepin. She said that PCP is aware that pt is having trouble sleeping. She would like to have providers thoughts on Rx medication? If PCP feels that it will work well for pt they would like to have a Rx sent to :    Pharmacy: [facility], [place] [phone] (Phone) [phone]

## 2018-04-30 NOTE — Addendum Note (Signed)
Addended by: Pricilla Holm A on: 04/30/2018 10:16 AM   Modules accepted: Orders

## 2018-05-08 ENCOUNTER — Other Ambulatory Visit: Payer: Self-pay | Admitting: Internal Medicine

## 2018-05-09 ENCOUNTER — Telehealth: Payer: Self-pay | Admitting: Internal Medicine

## 2018-05-09 NOTE — Telephone Encounter (Signed)
Patients wife would like a call back because she assumed the patient would be on brand Remeron and would like the doctor to advise if it is okay for the patient to take the generic. Please advise

## 2018-05-09 NOTE — Telephone Encounter (Signed)
Patients wife informed that yes it is okay to take the mirtazapine that we usually just say the brand name when we send things in. Patient wife stated understanding

## 2018-05-20 DIAGNOSIS — G894 Chronic pain syndrome: Secondary | ICD-10-CM | POA: Diagnosis not present

## 2018-05-20 DIAGNOSIS — Z79899 Other long term (current) drug therapy: Secondary | ICD-10-CM | POA: Diagnosis not present

## 2018-05-20 DIAGNOSIS — G629 Polyneuropathy, unspecified: Secondary | ICD-10-CM | POA: Diagnosis not present

## 2018-05-20 DIAGNOSIS — Z6841 Body Mass Index (BMI) 40.0 and over, adult: Secondary | ICD-10-CM | POA: Diagnosis not present

## 2018-05-26 ENCOUNTER — Telehealth: Payer: Self-pay | Admitting: Internal Medicine

## 2018-05-26 NOTE — Telephone Encounter (Signed)
Copied from [place] [id number]. Topic: General - Other >> Apr 30, 2018  9:35 AM Antonieta Iba C wrote: Reason for CRM: Pt's spouse called in to suggest the sleep medication Doxepin. She said that PCP is aware that pt is having trouble sleeping. She would like to have providers thoughts on Rx medication? If PCP feels that it will work well for pt they would like to have a Rx sent to :    Pharmacy: [facility], [place] [phone] (Phone) [phone] >> May 26, 2018  9:10 AM Loma Boston wrote:

## 2018-05-26 NOTE — Telephone Encounter (Signed)
Talked to patient about medication he says that it is working and that he thought the other medication the Doxepin was not covered by insurance. Told patient to just let us know if he needs anything else patient stated understanding

## 2018-06-05 MED ORDER — MIRTAZAPINE 30 MG PO TABS: 30.0000 mg | ORAL_TABLET | Freq: Every day | ORAL | 3 refills | Status: DC

## 2018-06-05 NOTE — Telephone Encounter (Signed)
Sent in

## 2018-06-05 NOTE — Telephone Encounter (Signed)
Patient calling and would like a call back to discuss this medication. Please advise.  CB#: [phone]

## 2018-06-05 NOTE — Telephone Encounter (Signed)
Patient is needing the RX for Mirtazapine to be increased from the 15mg  to the 30 mg. Patient has been doing the 30mg  to help sleep. To [employer]

## 2018-06-09 DIAGNOSIS — E669 Obesity, unspecified: Secondary | ICD-10-CM | POA: Diagnosis not present

## 2018-06-09 DIAGNOSIS — E785 Hyperlipidemia, unspecified: Secondary | ICD-10-CM | POA: Diagnosis not present

## 2018-06-09 DIAGNOSIS — F4321 Adjustment disorder with depressed mood: Secondary | ICD-10-CM | POA: Diagnosis not present

## 2018-06-09 DIAGNOSIS — G629 Polyneuropathy, unspecified: Secondary | ICD-10-CM | POA: Diagnosis not present

## 2018-06-09 DIAGNOSIS — G4700 Insomnia, unspecified: Secondary | ICD-10-CM | POA: Diagnosis not present

## 2018-06-09 DIAGNOSIS — J449 Chronic obstructive pulmonary disease, unspecified: Secondary | ICD-10-CM | POA: Diagnosis not present

## 2018-06-09 DIAGNOSIS — I10 Essential (primary) hypertension: Secondary | ICD-10-CM | POA: Diagnosis not present

## 2018-06-09 DIAGNOSIS — E119 Type 2 diabetes mellitus without complications: Secondary | ICD-10-CM | POA: Diagnosis not present

## 2018-06-09 DIAGNOSIS — G4733 Obstructive sleep apnea (adult) (pediatric): Secondary | ICD-10-CM | POA: Diagnosis not present

## 2018-07-14 ENCOUNTER — Telehealth: Payer: Self-pay | Admitting: Internal Medicine

## 2018-07-14 ENCOUNTER — Other Ambulatory Visit: Payer: Self-pay | Admitting: Internal Medicine

## 2018-07-14 MED ORDER — MIRTAZAPINE 30 MG PO TABS: 30.0000 mg | ORAL_TABLET | Freq: Every day | ORAL | 3 refills | Status: DC

## 2018-07-14 NOTE — Telephone Encounter (Signed)
He was prescribed the mirtazepine in April and it was started at 15 mg take 1 pill first week and then increase to 2 pills. The next month we increased this to the 30 mg pill. He should not take more than 1 pill of 30 mg.

## 2018-07-14 NOTE — Telephone Encounter (Signed)
Is it okay to re-send for 180tabs for 2 tabs at nightime?

## 2018-07-14 NOTE — Telephone Encounter (Signed)
They were told back in February by you if taking 1 tablet for 2 weeks did not work they could increase to 2 tablets but not go any higher than that as it is dangerous. States that they went to 2 tablets and that has been working?

## 2018-07-14 NOTE — Telephone Encounter (Signed)
Medication Refill - Medication: mirtazapine (REMERON) 30 MG tablet  Pt advised pharmacy that he takes 2 tabs at night/ please advise   Has the patient contacted their pharmacy? Yes.   (Agent: If no, request that the patient contact the pharmacy for the refill.) (Agent: If yes, when and what did the pharmacy advise?)  Preferred Pharmacy (with phone number or street name):  [facility], [place] [phone] (Phone) [phone] (Fax)     Agent: Please be advised that RX refills may take up to 3 business days. We ask that you follow-up with your pharmacy.

## 2018-07-14 NOTE — Telephone Encounter (Signed)
No, he should not take more than this than prescribed as it can affect the heart. Continue with 1 pill at night time only.

## 2018-07-14 NOTE — Telephone Encounter (Signed)
LVM with MD response  

## 2018-07-14 NOTE — Addendum Note (Signed)
Addended by: Raford Pitcher R on: 07/14/2018 02:12 PM   Modules accepted: Orders

## 2018-07-24 ENCOUNTER — Telehealth: Payer: Self-pay | Admitting: *Deleted

## 2018-07-24 NOTE — Telephone Encounter (Signed)
Called patient to schedule AWV, patient scheduled on 07/29/18.

## 2018-07-25 ENCOUNTER — Encounter: Payer: Self-pay | Admitting: Internal Medicine

## 2018-07-25 ENCOUNTER — Ambulatory Visit (INDEPENDENT_AMBULATORY_CARE_PROVIDER_SITE_OTHER): Payer: Medicare HMO | Admitting: Internal Medicine

## 2018-07-25 ENCOUNTER — Other Ambulatory Visit: Payer: Self-pay

## 2018-07-25 ENCOUNTER — Other Ambulatory Visit (INDEPENDENT_AMBULATORY_CARE_PROVIDER_SITE_OTHER): Payer: Medicare HMO

## 2018-07-25 VITALS — BP 122/80 | HR 106 | Temp 98.4°F | Ht 70.0 in | Wt 279.0 lb

## 2018-07-25 DIAGNOSIS — R945 Abnormal results of liver function studies: Secondary | ICD-10-CM

## 2018-07-25 DIAGNOSIS — Z0000 Encounter for general adult medical examination without abnormal findings: Secondary | ICD-10-CM

## 2018-07-25 DIAGNOSIS — I10 Essential (primary) hypertension: Secondary | ICD-10-CM

## 2018-07-25 DIAGNOSIS — R7989 Other specified abnormal findings of blood chemistry: Secondary | ICD-10-CM

## 2018-07-25 DIAGNOSIS — E1169 Type 2 diabetes mellitus with other specified complication: Secondary | ICD-10-CM | POA: Diagnosis not present

## 2018-07-25 DIAGNOSIS — F5101 Primary insomnia: Secondary | ICD-10-CM | POA: Diagnosis not present

## 2018-07-25 DIAGNOSIS — M79672 Pain in left foot: Secondary | ICD-10-CM | POA: Diagnosis not present

## 2018-07-25 DIAGNOSIS — E785 Hyperlipidemia, unspecified: Secondary | ICD-10-CM

## 2018-07-25 DIAGNOSIS — M79671 Pain in right foot: Secondary | ICD-10-CM | POA: Diagnosis not present

## 2018-07-25 LAB — COMPREHENSIVE METABOLIC PANEL
ALT: 57 U/L — ABNORMAL HIGH (ref 0–53)
AST: 25 U/L (ref 0–37)
Albumin: 4.5 g/dL (ref 3.5–5.2)
Alkaline Phosphatase: 69 U/L (ref 39–117)
BUN: 10 mg/dL (ref 6–23)
CO2: 28 mEq/L (ref 19–32)
Calcium: 9.6 mg/dL (ref 8.4–10.5)
Chloride: 99 mEq/L (ref 96–112)
Creatinine, Ser: 0.71 mg/dL (ref 0.40–1.50)
GFR: 137.06 mL/min (ref >60.00)
Glucose, Bld: 213 mg/dL — ABNORMAL HIGH (ref 70–99)
Potassium: 4 mEq/L (ref 3.5–5.1)
Sodium: 138 mEq/L (ref 135–145)
Total Bilirubin: 1 mg/dL (ref 0.2–1.2)
Total Protein: 7.7 g/dL (ref 6.0–8.3)

## 2018-07-25 LAB — CBC
HCT: 43.6 % (ref 39.0–52.0)
Hemoglobin: 14.3 g/dL (ref 13.0–17.0)
MCHC: 32.7 g/dL (ref 30.0–36.0)
MCV: 87.5 fl (ref 78.0–100.0)
Platelets: 177 10*3/uL (ref 150.0–400.0)
RBC: 4.99 Mil/uL (ref 4.22–5.81)
RDW: 14.4 % (ref 11.5–15.5)
WBC: 6.4 10*3/uL (ref 4.0–10.5)

## 2018-07-25 LAB — LIPID PANEL
Cholesterol: 120 mg/dL (ref 0–200)
HDL: 50.1 mg/dL (ref >39.00)
LDL Cholesterol: 56 mg/dL (ref 0–99)
NonHDL: 69.71
Total CHOL/HDL Ratio: 2
Triglycerides: 71 mg/dL (ref 0.0–149.0)
VLDL: 14.2 mg/dL (ref 0.0–40.0)

## 2018-07-25 LAB — HEMOGLOBIN A1C: Hgb A1c MFr Bld: 9.2 % — ABNORMAL HIGH (ref 4.6–6.5)

## 2018-07-25 MED ORDER — MIRTAZAPINE 45 MG PO TABS: 45.0000 mg | ORAL_TABLET | Freq: Every day | ORAL | 3 refills | Status: DC

## 2018-07-25 NOTE — Assessment & Plan Note (Signed)
Chronic stable and seeing pain management.

## 2018-07-25 NOTE — Assessment & Plan Note (Signed)
Checking HgA1c, his sugars have been labile in the past. Most recent at goal on acarbose, glipizide, metformin, januvia. Adjust as needed. On ARB and statin. Complicated by hyperlipidemia.

## 2018-07-25 NOTE — Progress Notes (Signed)
   Subjective:   Patient ID: Douglas Robinson, male    DOB: 29-May-1960, 58 y.o.   MRN: [id number]  HPI The patient is a 58 YO man coming in for physical. Some confusion about how he is taking his sleeping medication.   PMH, [facility], social history reviewed and updated  Review of Systems  Constitutional: Negative.   HENT: Negative.   Eyes: Negative.   Respiratory: Negative for cough, chest tightness and shortness of breath.   Cardiovascular: Negative for chest pain, palpitations and leg swelling.  Gastrointestinal: Negative for abdominal distention, abdominal pain, constipation, diarrhea, nausea and vomiting.  Musculoskeletal: Positive for arthralgias, back pain, gait problem and myalgias.  Skin: Negative.   Neurological: Negative for dizziness, tremors, weakness, light-headedness and headaches.  Psychiatric/Behavioral: Positive for decreased concentration.    Objective:  Physical Exam Constitutional:      Appearance: He is well-developed. He is obese.  HENT:     Head: Normocephalic and atraumatic.  Neck:     Musculoskeletal: Normal range of motion.  Cardiovascular:     Rate and Rhythm: Normal rate and regular rhythm.  Pulmonary:     Effort: Pulmonary effort is normal. No respiratory distress.     Breath sounds: Normal breath sounds. No wheezing or rales.  Abdominal:     General: Bowel sounds are normal. There is no distension.     Palpations: Abdomen is soft.     Tenderness: There is no abdominal tenderness. There is no rebound.  Skin:    General: Skin is warm and dry.     Comments: Foot exam done  Neurological:     Mental Status: He is alert and oriented to person, place, and time.     Coordination: Coordination normal.     Vitals:   07/25/18 0824  BP: 122/80  Pulse: (!) 106  Temp: 98.4 F (36.9 C)  TempSrc: Oral  SpO2: 94%  Weight: 279 lb (126.6 kg)  Height: 5\' 10"  (1.778 m)    Assessment & Plan:

## 2018-07-25 NOTE — Assessment & Plan Note (Signed)
Okay with increase to 45 mg mirtazepine but counseled several times that he is not to take this during the day. Apparently in the past everytime he is taking hydrocodone he also takes a sleeping pill. He is strongly counseled against this. If his pain medication is giving him side effects he is counseled to inform his pain management doctor as we do not want to promote daytime sleeping.

## 2018-07-25 NOTE — Assessment & Plan Note (Signed)
Flu shot counseled. Pneumonia up to date until 12. Shingrix counseled. Tetanus up to date. Colonoscopy due 2021. Counseled about sun safety and mole surveillance. Counseled about the dangers of distracted driving. Given 10 year screening recommendations.

## 2018-07-25 NOTE — Patient Instructions (Signed)

## 2018-07-25 NOTE — Assessment & Plan Note (Signed)
Checking lipid panel and adjust pravastatin 10 mg daily if needed.  

## 2018-07-25 NOTE — Assessment & Plan Note (Signed)
Checking CMP and adjust as needed. Likely due to NAFLD

## 2018-07-25 NOTE — Assessment & Plan Note (Signed)
BP at goal on his irbesartan and hctz. Checking CMP and adjust as needed.

## 2018-07-29 ENCOUNTER — Other Ambulatory Visit: Payer: Self-pay | Admitting: Internal Medicine

## 2018-07-29 ENCOUNTER — Ambulatory Visit: Payer: Medicare HMO

## 2018-07-29 MED ORDER — JARDIANCE 25 MG PO TABS: 25.0000 mg | ORAL_TABLET | Freq: Every day | ORAL | 2 refills | Status: DC

## 2018-07-31 DIAGNOSIS — Z79899 Other long term (current) drug therapy: Secondary | ICD-10-CM | POA: Diagnosis not present

## 2018-08-06 ENCOUNTER — Ambulatory Visit (INDEPENDENT_AMBULATORY_CARE_PROVIDER_SITE_OTHER): Payer: Medicare HMO | Admitting: *Deleted

## 2018-08-06 DIAGNOSIS — Z0000 Encounter for general adult medical examination without abnormal findings: Secondary | ICD-10-CM | POA: Diagnosis not present

## 2018-08-06 NOTE — Progress Notes (Addendum)
Subjective:   Douglas Robinson is a 58 y.o. male who presents for an Initial Medicare Annual Wellness Visit. I connected with patient by a telephone and verified that I am speaking with the correct person using two identifiers. Patient stated full name and DOB. Patient gave permission to continue with telephonic visit. Patient's location was at home and Nurse's location was at [place] office.   Review of Systems   Cardiac Risk Factors include: advanced age (>10men, >77 women);diabetes mellitus;dyslipidemia;male gender;hypertension;obesity (BMI >30kg/m2) Sleep patterns: has interrupted sleep, gets up 1-2 times nightly to void and sleeps 6-7 hours nightly. Wears C-PAP   Home Safety/Smoke Alarms: Feels safe in home. Smoke alarms in place.  Living environment; residence and Firearm Safety: apartment. Lives with wife, no needs for DME, good support system Seat Belt Safety/Bike Helmet: Wears seat belt.   PSA-  Lab Results  Component Value Date   PSA 0.98 05/17/2009      Objective:    Today's Vitals   08/06/18 0908  PainSc: 5    There is no height or weight on file to calculate BMI.  Advanced Directives 08/06/2018 12/27/2014 12/14/2014 07/29/2013 07/29/2013  Does Patient Have a Medical Advance Directive? No No No Patient would like information Patient does not have advance directive;Patient would not like information  Would patient like information on creating a medical advance directive? Yes (ED - Information included in AVS) - - Advance directive brochure given (Outpatient ONLY) -    Current Medications (verified) Outpatient Encounter Medications as of 08/06/2018  Medication Sig  . acarbose (PRECOSE) 50 MG tablet Take 1 tablet (50 mg total) by mouth 3 (three) times daily with meals.  Marland Kitchen ACCU-CHEK AVIVA PLUS test strip TEST BLOOD SUGAR TWICE DAILY  . albuterol (VENTOLIN HFA) 108 (90 Base) MCG/ACT inhaler INHALE 1 TO 2 PUFFS EVERY 6 HOURS AS NEEDED FOR WHEEZING  OR FOR SHORTNESS OF  BREATH  . aspirin 81 MG tablet Take 81 mg by mouth daily.  . empagliflozin (JARDIANCE) 25 MG TABS tablet Take 25 mg by mouth daily.  Marland Kitchen glipiZIDE (GLIPIZIDE XL) 10 MG 24 hr tablet Take 1 tablet (10 mg total) by mouth daily with breakfast.  . hydrochlorothiazide (HYDRODIURIL) 25 MG tablet Take 1 tablet (25 mg total) by mouth daily.  Marland Kitchen HYDROcodone-acetaminophen (NORCO) 10-325 MG per tablet Take 1 tablet by mouth every 6 (six) hours as needed.  . irbesartan (AVAPRO) 300 MG tablet Take 1 tablet (300 mg total) by mouth daily.  . metFORMIN (GLUCOPHAGE) 1000 MG tablet Take 1 tablet (1,000 mg total) by mouth 2 (two) times daily with a meal.  . mirtazapine (REMERON) 45 MG tablet Take 1 tablet (45 mg total) by mouth at bedtime.  . Multiple Vitamin (MULTIVITAMIN) tablet Take 1 tablet by mouth daily.  . pantoprazole (PROTONIX) 20 MG tablet Take 1 tablet (20 mg total) by mouth daily.  . pravastatin (PRAVACHOL) 10 MG tablet Take 1 tablet (10 mg total) by mouth daily.  Marland Kitchen PRODIGY TWIST TOP LANCETS 28G MISC Use one lancet at each glucose check, as needed.  . sitaGLIPtin (JANUVIA) 100 MG tablet Take 1 tablet (100 mg total) by mouth daily.   No facility-administered encounter medications on file as of 08/06/2018.     Allergies (verified) Naproxen   History: Past Medical History:  Diagnosis Date  . Anxiety   . COPD (chronic obstructive pulmonary disease) ([place])   . Diabetes ([place])   . GERD (gastroesophageal reflux disease)   . HTN (hypertension)   .  Hyperlipidemia   . OSA on CPAP   . Sleep apnea    Past Surgical History:  Procedure Laterality Date  . CARPAL TUNNEL RELEASE Right   . COLONOSCOPY  2013  . HERNIA REPAIR    . POLYPECTOMY  2013   4 polyps-TA  . REPAIR ANKLE LIGAMENT  1995   "Sometime before 1996"   Family History  Problem Relation Age of Onset  . Cancer - Prostate Father   . Colon polyps Father   . Colon cancer Neg Hx   . Rectal cancer Neg Hx   . Stomach cancer Neg Hx    Social  History   Socioeconomic History  . Marital status: Married    Spouse name: Not on file  . Number of children: Not on file  . Years of education: Not on file  . Highest education level: Not on file  Occupational History  . Occupation: disabilty  Social Needs  . Financial resource strain: Not hard at all  . Food insecurity    Worry: Never true    Inability: Never true  . Transportation needs    Medical: No    Non-medical: No  Tobacco Use  . Smoking status: Never Smoker  . Smokeless tobacco: Never Used  Substance and Sexual Activity  . Alcohol use: No    Alcohol/week: 0.0 standard drinks  . Drug use: No  . Sexual activity: Yes  Lifestyle  . Physical activity    Days per week: 4 days    Minutes per session: 50 min  . Stress: Only a little  Relationships  . Social connections    Talks on phone: More than three times a week    Gets together: More than three times a week    Attends religious service: 1 to 4 times per year    Active member of club or organization: Yes    Attends meetings of clubs or organizations: 1 to 4 times per year    Relationship status: Married  Other Topics Concern  . Not on file  Social History Narrative  . Not on file   Tobacco Counseling Counseling given: Not Answered  Activities of Daily Living In your present state of health, do you have any difficulty performing the following activities: 08/06/2018  Hearing? N  Vision? N  Difficulty concentrating or making decisions? N  Walking or climbing stairs? N  Dressing or bathing? N  Doing errands, shopping? N  Preparing Food and eating ? N  Using the Toilet? N  In the past six months, have you accidently leaked urine? N  Do you have problems with loss of bowel control? N  Managing your Medications? N  Managing your Finances? N  Housekeeping or managing your Housekeeping? N  Some recent data might be hidden     Immunizations and Health Maintenance Immunization History  Administered Date(s)  Administered  . Influenza-Unspecified 09/23/2017  . Tdap 11/06/2017   Health Maintenance Due  Topic Date Due  . OPHTHALMOLOGY EXAM  02/07/2018    Patient Care Team: Hoyt Koch, MD as PCP - General (Internal Medicine) Milus Banister, MD as Attending Physician (Gastroenterology) Rutherford Guys, MD as Consulting Physician (Ophthalmology) Jeanella Anton, NP as Nurse Practitioner (Nurse Practitioner)  Indicate any recent Medical Services you may have received from other than [facility] providers in the past year (date may be approximate).    Assessment:   This is a routine wellness examination for [place]. Physical assessment deferred to PCP.   Hearing/Vision screen  Hearing Screening   125Hz  250Hz  500Hz  1000Hz  2000Hz  3000Hz  4000Hz  6000Hz  8000Hz   Right ear:           Left ear:           Comments: Able to hear conversational tones w/o difficulty. No issues reported.    Vision Screening Comments: appointment yearly Dr. Gershon Crane, has an upcoming appointment and will request results to be sent to PCP.  Dietary issues and exercise activities discussed: Current Exercise Habits: Home exercise routine, Type of exercise: calisthenics;stretching(works with bands), Time (Minutes): 30, Frequency (Times/Week): 5, Weekly Exercise (Minutes/Week): 150, Intensity: Mild, Exercise limited by: orthopedic condition(s)  Diet (meal preparation, eat out, water intake, caffeinated beverages, dairy products, fruits and vegetables): in general, a "healthy" diet  , well balanced. Patient reports that he is monitoring his diet closely to get his Hgb A1c lower. He states he know what to do and does not need any diet education at this time. Also states that he is motivated at this time to improve his diabetes for his overall health.   Reviewed heart healthy and diabetic diet. Encouraged patient to increase daily water and healthy fluid intake.  Goals   None    Depression Screen PHQ 2/9 Scores  08/06/2018 09/30/2017 08/29/2017 07/29/2013  PHQ - 2 Score 2 3 1  0  PHQ- 9 Score 3 12 - -    Fall Risk Fall Risk  08/06/2018 07/29/2013  Falls in the past year? 0 No  Number falls in past yr: 0 -  Risk for fall due to : Impaired mobility;Impaired balance/gait -  Follow up Falls prevention discussed -   Cognitive Function:       Ad8 score reviewed for issues:  Issues making decisions: no  Less interest in hobbies / activities: no  Repeats questions, stories (family complaining): no  Trouble using ordinary gadgets (microwave, computer, phone):no  Forgets the month or year: no  Mismanaging finances: no  Remembering appts: no  Daily problems with thinking and/or memory: no Ad8 score is= 0 Screening Tests Health Maintenance  Topic Date Due  . OPHTHALMOLOGY EXAM  02/07/2018  . HIV Screening  07/25/2019 (Originally 10/31/1975)  . INFLUENZA VACCINE  08/16/2018  . HEMOGLOBIN A1C  01/25/2019  . FOOT EXAM  07/25/2019  . COLONOSCOPY  12/27/2019  . TETANUS/TDAP  11/07/2027  . PNEUMOCOCCAL POLYSACCHARIDE VACCINE AGE 27-64 HIGH RISK  Completed  . Hepatitis C Screening  Completed      Plan:     Reviewed health maintenance screenings with patient today and relevant education, vaccines, and/or referrals were provided.   Continue doing brain stimulating activities (puzzles, reading, adult coloring books, staying active) to keep memory sharp.   Continue to eat heart healthy diet (full of fruits, vegetables, whole grains, lean protein, water--limit salt, fat, and sugar intake) and increase physical activity as tolerated.  I have personally reviewed and noted the following in the patient's chart:   . Medical and social history . Use of alcohol, tobacco or illicit drugs  . Current medications and supplements . Functional ability and status . Nutritional status . Physical activity . Advanced directives . List of other physicians . Screenings to include cognitive, depression, and  falls . Referrals and appointments  In addition, I have reviewed and discussed with patient certain preventive protocols, quality metrics, and best practice recommendations. A written personalized care plan for preventive services as well as general preventive health recommendations were provided to patient.     Michiel Cowboy, RN  08/06/2018     Medical screening examination/treatment/procedure(s) were performed by non-physician practitioner and as supervising physician I was immediately available for consultation/collaboration. I agree with above. Scarlette Calico, MD

## 2018-08-13 ENCOUNTER — Telehealth: Payer: Self-pay | Admitting: Internal Medicine

## 2018-08-13 ENCOUNTER — Other Ambulatory Visit: Payer: Self-pay | Admitting: Internal Medicine

## 2018-08-13 MED ORDER — METFORMIN HCL 1000 MG PO TABS: 1000.0000 mg | ORAL_TABLET | Freq: Two times a day (BID) | ORAL | 3 refills | Status: DC

## 2018-08-13 NOTE — Telephone Encounter (Signed)
Medication Refill - Medication: metFORMIN (GLUCOPHAGE) 1000 MG tablet   Has the patient contacted their pharmacy? Yes.   (Agent: If no, request that the patient contact the pharmacy for the refill.) (Agent: If yes, when and what did the pharmacy advise?)  Preferred Pharmacy (with phone number or street name):  [facility], [address]  [place] [address]  Phone: [phone] Fax: [phone]  Not a 24 hour pharmacy; exact hours not known.     Agent: Please be advised that RX refills may take up to 3 business days. We ask that you follow-up with your pharmacy.

## 2018-08-13 NOTE — Telephone Encounter (Signed)
Sent!

## 2018-08-19 ENCOUNTER — Other Ambulatory Visit: Payer: Self-pay | Admitting: Internal Medicine

## 2018-09-30 DIAGNOSIS — G8929 Other chronic pain: Secondary | ICD-10-CM | POA: Diagnosis not present

## 2018-09-30 DIAGNOSIS — Z760 Encounter for issue of repeat prescription: Secondary | ICD-10-CM | POA: Diagnosis not present

## 2018-09-30 DIAGNOSIS — G894 Chronic pain syndrome: Secondary | ICD-10-CM | POA: Diagnosis not present

## 2018-09-30 DIAGNOSIS — Z79899 Other long term (current) drug therapy: Secondary | ICD-10-CM | POA: Diagnosis not present

## 2018-09-30 DIAGNOSIS — M25571 Pain in right ankle and joints of right foot: Secondary | ICD-10-CM | POA: Diagnosis not present

## 2018-10-16 ENCOUNTER — Other Ambulatory Visit: Payer: Self-pay

## 2018-10-16 MED ORDER — JARDIANCE 25 MG PO TABS: 25.0000 mg | ORAL_TABLET | Freq: Every day | ORAL | 1 refills | Status: DC

## 2018-10-17 ENCOUNTER — Other Ambulatory Visit: Payer: Self-pay | Admitting: Internal Medicine

## 2018-10-27 ENCOUNTER — Encounter: Payer: Self-pay | Admitting: Internal Medicine

## 2018-10-27 DIAGNOSIS — H2513 Age-related nuclear cataract, bilateral: Secondary | ICD-10-CM | POA: Diagnosis not present

## 2018-10-27 DIAGNOSIS — H524 Presbyopia: Secondary | ICD-10-CM | POA: Diagnosis not present

## 2018-10-27 DIAGNOSIS — E119 Type 2 diabetes mellitus without complications: Secondary | ICD-10-CM | POA: Diagnosis not present

## 2018-10-27 LAB — HM DIABETES EYE EXAM

## 2018-10-27 NOTE — Progress Notes (Signed)
Abstracted and sent to scan  

## 2018-11-20 DIAGNOSIS — G629 Polyneuropathy, unspecified: Secondary | ICD-10-CM | POA: Diagnosis not present

## 2018-11-20 DIAGNOSIS — Z79899 Other long term (current) drug therapy: Secondary | ICD-10-CM | POA: Diagnosis not present

## 2018-11-20 DIAGNOSIS — G894 Chronic pain syndrome: Secondary | ICD-10-CM | POA: Diagnosis not present

## 2018-11-26 ENCOUNTER — Ambulatory Visit (INDEPENDENT_AMBULATORY_CARE_PROVIDER_SITE_OTHER): Payer: Medicare HMO | Admitting: Internal Medicine

## 2018-11-26 ENCOUNTER — Encounter: Payer: Self-pay | Admitting: Internal Medicine

## 2018-11-26 ENCOUNTER — Other Ambulatory Visit: Payer: Self-pay

## 2018-11-26 VITALS — BP 120/86 | HR 97 | Temp 99.1°F | Ht 70.0 in | Wt 275.0 lb

## 2018-11-26 DIAGNOSIS — E785 Hyperlipidemia, unspecified: Secondary | ICD-10-CM | POA: Diagnosis not present

## 2018-11-26 DIAGNOSIS — E1169 Type 2 diabetes mellitus with other specified complication: Secondary | ICD-10-CM | POA: Diagnosis not present

## 2018-11-26 LAB — POCT GLYCOSYLATED HEMOGLOBIN (HGB A1C): Hemoglobin A1C: 6.6 % — AB (ref 4.0–5.6)

## 2018-11-26 NOTE — Assessment & Plan Note (Signed)
POC HgA1c done in office which is much improved. Talked about the importance of continuing compliance and follow up in 3 months. Continue glipizide, jardiance, acarbose, metformin and januvia.

## 2018-11-26 NOTE — Progress Notes (Signed)
   Subjective:   Patient ID: Douglas Robinson, male    DOB: Sep 22, 1960, 58 y.o.   MRN: [id number]  HPI The patient is a 58 YO man coming in for follow up of diabetes (not well controlled most recently, taking jardiance and glipizide and januvia and metformin and acarbose, on ARB and statin). Denies new concerns although he struggles with his chronic pain. Feels like he is doing much better with the sugars. Admits to doing almost nothing he should have due to loss of brother before last labs. Rare low sugar and does sugar pill with good results. Only 1-2 times in the last 3 months.   Review of Systems  Constitutional: Negative.   HENT: Negative.   Eyes: Negative.   Respiratory: Negative for cough, chest tightness and shortness of breath.   Cardiovascular: Negative for chest pain, palpitations and leg swelling.  Gastrointestinal: Negative for abdominal distention, abdominal pain, constipation, diarrhea, nausea and vomiting.  Musculoskeletal: Positive for arthralgias, back pain and myalgias.  Skin: Negative.   Neurological: Positive for dizziness. Negative for facial asymmetry and headaches.  Psychiatric/Behavioral: Negative.     Objective:  Physical Exam Constitutional:      Appearance: He is well-developed. He is obese.  HENT:     Head: Normocephalic and atraumatic.  Neck:     Musculoskeletal: Normal range of motion.  Cardiovascular:     Rate and Rhythm: Normal rate and regular rhythm.  Pulmonary:     Effort: Pulmonary effort is normal. No respiratory distress.     Breath sounds: Normal breath sounds. No wheezing or rales.  Abdominal:     General: Bowel sounds are normal. There is no distension.     Palpations: Abdomen is soft.     Tenderness: There is no abdominal tenderness. There is no rebound.  Musculoskeletal:        General: Tenderness present.  Skin:    General: Skin is warm and dry.  Neurological:     Mental Status: He is alert and oriented to person, place, and time.      Coordination: Coordination normal.     Vitals:   11/26/18 0807  BP: 120/86  Pulse: 97  Temp: 99.1 F (37.3 C)  TempSrc: Oral  SpO2: 93%  Weight: 275 lb (124.7 kg)  Height: 5\' 10"  (1.778 m)    Assessment & Plan:

## 2018-11-26 NOTE — Patient Instructions (Signed)
Diabetes Mellitus and Standards of Medical Care Managing diabetes (diabetes mellitus) can be complicated. Your diabetes treatment may be managed by a team of health care providers, including:  A physician who specializes in diabetes (endocrinologist).  A nurse practitioner or physician assistant.  Nurses.  A diet and nutrition specialist (registered dietitian).  A certified diabetes educator (CDE).  An exercise specialist.  A pharmacist.  An eye doctor.  A foot specialist (podiatrist).  A dentist.  A primary care provider.  A mental health provider. Your health care providers follow guidelines to help you get the best quality of care. The following schedule is a general guideline for your diabetes management plan. Your health care providers may give you more specific instructions. Physical exams Upon being diagnosed with diabetes mellitus, and each year after that, your health care provider will ask about your medical and family history. He or she will also do a physical exam. Your exam may include:  Measuring your height, weight, and body mass index (BMI).  Checking your blood pressure. This will be done at every routine medical visit. Your target blood pressure may vary depending on your medical conditions, your age, and other factors.  Thyroid gland exam.  Skin exam.  Screening for damage to your nerves (peripheral neuropathy). This may include checking the pulse in your legs and feet and checking the level of sensation in your hands and feet.  A complete foot exam to inspect the structure and skin of your feet, including checking for cuts, bruises, redness, blisters, sores, or other problems.  Screening for blood vessel (vascular) problems, which may include checking the pulse in your legs and feet and checking your temperature. Blood tests Depending on your treatment plan and your personal needs, you may have the following tests done:  HbA1c (hemoglobin A1c). This  test provides information about blood sugar (glucose) control over the previous 2-3 months. It is used to adjust your treatment plan, if needed. This test will be done: ? At least 2 times a year, if you are meeting your treatment goals. ? 4 times a year, if you are not meeting your treatment goals or if treatment goals have changed.  Lipid testing, including total, LDL, and HDL cholesterol and triglyceride levels. ? The goal for LDL is less than 100 mg/dL (5.5 mmol/L). If you are at high risk for complications, the goal is less than 70 mg/dL (3.9 mmol/L). ? The goal for HDL is 40 mg/dL (2.2 mmol/L) or higher for men and 50 mg/dL (2.8 mmol/L) or higher for women. An HDL cholesterol of 60 mg/dL (3.3 mmol/L) or higher gives some protection against heart disease. ? The goal for triglycerides is less than 150 mg/dL (8.3 mmol/L).  Liver function tests.  Kidney function tests.  Thyroid function tests. Dental and eye exams  Visit your dentist two times a year.  If you have type 1 diabetes, your health care provider may recommend an eye exam 3-5 years after you are diagnosed, and then once a year after your first exam. ? For children with type 1 diabetes, a health care provider may recommend an eye exam when your child is age 10 or older and has had diabetes for 3-5 years. After the first exam, your child should get an eye exam once a year.  If you have type 2 diabetes, your health care provider may recommend an eye exam as soon as you are diagnosed, and then once a year after your first exam. Immunizations   The  yearly flu (influenza) vaccine is recommended for everyone 6 months or older who has diabetes.  The pneumonia (pneumococcal) vaccine is recommended for everyone 2 years or older who has diabetes. If you are 65 or older, you may get the pneumonia vaccine as a series of two separate shots.  The hepatitis B vaccine is recommended for adults shortly after being diagnosed with diabetes.   Adults and children with diabetes should receive all other vaccines according to age-specific recommendations from the Centers for Disease Control and Prevention (CDC). Mental and emotional health Screening for symptoms of eating disorders, anxiety, and depression is recommended at the time of diagnosis and afterward as needed. If your screening shows that you have symptoms (positive screening result), you may need more evaluation and you may work with a mental health care provider. Treatment plan Your treatment plan will be reviewed at every medical visit. You and your health care provider will discuss:  How you are taking your medicines, including insulin.  Any side effects you are experiencing.  Your blood glucose target goals.  The frequency of your blood glucose monitoring.  Lifestyle habits, such as activity level as well as tobacco, alcohol, and substance use. Diabetes self-management education Your health care provider will assess how well you are monitoring your blood glucose levels and whether you are taking your insulin correctly. He or she may refer you to:  A certified diabetes educator to manage your diabetes throughout your life, starting at diagnosis.  A registered dietitian who can create or review your personal nutrition plan.  An exercise specialist who can discuss your activity level and exercise plan. Summary  Managing diabetes (diabetes mellitus) can be complicated. Your diabetes treatment may be managed by a team of health care providers.  Your health care providers follow guidelines in order to help you get the best quality of care.  Standards of care including having regular physical exams, blood tests, blood pressure monitoring, immunizations, screening tests, and education about how to manage your diabetes.  Your health care providers may also give you more specific instructions based on your individual health. This information is not intended to replace  advice given to you by your health care provider. Make sure you discuss any questions you have with your health care provider. Document Released: 10/29/2008 Document Revised: 09/20/2017 Document Reviewed: 09/30/2015 Elsevier Patient Education  2020 Elsevier Inc[employer]  

## 2018-12-16 ENCOUNTER — Other Ambulatory Visit: Payer: Self-pay | Admitting: Internal Medicine

## 2019-01-20 DIAGNOSIS — G629 Polyneuropathy, unspecified: Secondary | ICD-10-CM | POA: Diagnosis not present

## 2019-01-20 DIAGNOSIS — Z79899 Other long term (current) drug therapy: Secondary | ICD-10-CM | POA: Diagnosis not present

## 2019-01-20 DIAGNOSIS — G894 Chronic pain syndrome: Secondary | ICD-10-CM | POA: Diagnosis not present

## 2019-02-26 ENCOUNTER — Other Ambulatory Visit: Payer: Self-pay

## 2019-02-26 ENCOUNTER — Encounter: Payer: Self-pay | Admitting: Internal Medicine

## 2019-02-26 ENCOUNTER — Ambulatory Visit (INDEPENDENT_AMBULATORY_CARE_PROVIDER_SITE_OTHER): Payer: Medicare HMO | Admitting: Internal Medicine

## 2019-02-26 VITALS — BP 126/78 | HR 107 | Temp 98.0°F | Ht 70.0 in | Wt 273.0 lb

## 2019-02-26 DIAGNOSIS — I10 Essential (primary) hypertension: Secondary | ICD-10-CM | POA: Diagnosis not present

## 2019-02-26 DIAGNOSIS — E1169 Type 2 diabetes mellitus with other specified complication: Secondary | ICD-10-CM

## 2019-02-26 DIAGNOSIS — E785 Hyperlipidemia, unspecified: Secondary | ICD-10-CM | POA: Diagnosis not present

## 2019-02-26 DIAGNOSIS — F5101 Primary insomnia: Secondary | ICD-10-CM

## 2019-02-26 LAB — POCT GLYCOSYLATED HEMOGLOBIN (HGB A1C): Hemoglobin A1C: 7.1 % — AB (ref 4.0–5.6)

## 2019-02-26 NOTE — Assessment & Plan Note (Signed)
Taking remeron and seems to be effective with most nights 4-5 hours sleep. Is napping 2 hours or more during the day and we talked about how this disturbs the night time sleeping.

## 2019-02-26 NOTE — Assessment & Plan Note (Signed)
POC HgA1c done in office 7.1 which is at goal. Keep glipizide, metformin, acarbose, jardiance. Complicated by hyperlipidemia. Taking ARB and statin.

## 2019-02-26 NOTE — Progress Notes (Signed)
   Subjective:   Patient ID: Douglas Robinson, male    DOB: Oct 09, 1960, 59 y.o.   MRN: [id number]  HPI The patient is a 59 YO man coming in for follow up diabetes (taking jardiance and acarbose and glipizide and metformin, taking statin and ARB, no changes to feet, no excessive thirst or urination, not checking sugars recently) and insomnia (taking remeron, mostly sleeping for 4-5 hour stretch, does nap at least 2 hours during day after taking pain medication, loss of friend in September has caused some more emotional distress) and blood pressure (taking hctz and irbesartan, denies headaches or chest pains, denies side effects of medications).   Review of Systems  Constitutional: Positive for activity change. Negative for appetite change, fatigue, fever and unexpected weight change.  HENT: Negative.   Eyes: Negative.   Respiratory: Negative for cough, chest tightness and shortness of breath.   Cardiovascular: Negative for chest pain, palpitations and leg swelling.  Gastrointestinal: Negative for abdominal distention, abdominal pain, constipation, diarrhea, nausea and vomiting.  Musculoskeletal: Positive for arthralgias and back pain.  Skin: Negative.   Neurological: Negative.   Psychiatric/Behavioral: Positive for decreased concentration, dysphoric mood and sleep disturbance.    Objective:  Physical Exam Constitutional:      Appearance: He is well-developed.  HENT:     Head: Normocephalic and atraumatic.  Cardiovascular:     Rate and Rhythm: Normal rate and regular rhythm.  Pulmonary:     Effort: Pulmonary effort is normal. No respiratory distress.     Breath sounds: Normal breath sounds. No wheezing or rales.  Abdominal:     General: Bowel sounds are normal. There is no distension.     Palpations: Abdomen is soft.     Tenderness: There is no abdominal tenderness. There is no rebound.  Musculoskeletal:        General: Tenderness present.     Cervical back: Normal range of motion.   Skin:    General: Skin is warm and dry.  Neurological:     Mental Status: He is alert and oriented to person, place, and time.     Coordination: Coordination normal.     Vitals:   02/26/19 0747  BP: 126/78  Pulse: (!) 107  Temp: 98 F (36.7 C)  TempSrc: Oral  SpO2: 95%  Weight: 273 lb (123.8 kg)  Height: 5\' 10"  (1.778 m)    This visit occurred during the SARS-CoV-2 public health emergency.  Safety protocols were in place, including screening questions prior to the visit, additional usage of staff PPE, and extensive cleaning of exam room while observing appropriate contact time as indicated for disinfecting solutions.   Assessment & Plan:

## 2019-02-26 NOTE — Patient Instructions (Signed)
Diabetes Mellitus and Standards of Medical Care Managing diabetes (diabetes mellitus) can be complicated. Your diabetes treatment may be managed by a team of health care providers, including:  A physician who specializes in diabetes (endocrinologist).  A nurse practitioner or physician assistant.  Nurses.  A diet and nutrition specialist (registered dietitian).  A certified diabetes educator (CDE).  An exercise specialist.  A pharmacist.  An eye doctor.  A foot specialist (podiatrist).  A dentist.  A primary care provider.  A mental health provider. Your health care providers follow guidelines to help you get the best quality of care. The following schedule is a general guideline for your diabetes management plan. Your health care providers may give you more specific instructions. Physical exams Upon being diagnosed with diabetes mellitus, and each year after that, your health care provider will ask about your medical and family history. He or she will also do a physical exam. Your exam may include:  Measuring your height, weight, and body mass index (BMI).  Checking your blood pressure. This will be done at every routine medical visit. Your target blood pressure may vary depending on your medical conditions, your age, and other factors.  Thyroid gland exam.  Skin exam.  Screening for damage to your nerves (peripheral neuropathy). This may include checking the pulse in your legs and feet and checking the level of sensation in your hands and feet.  A complete foot exam to inspect the structure and skin of your feet, including checking for cuts, bruises, redness, blisters, sores, or other problems.  Screening for blood vessel (vascular) problems, which may include checking the pulse in your legs and feet and checking your temperature. Blood tests Depending on your treatment plan and your personal needs, you may have the following tests done:  HbA1c (hemoglobin A1c). This  test provides information about blood sugar (glucose) control over the previous 2-3 months. It is used to adjust your treatment plan, if needed. This test will be done: ? At least 2 times a year, if you are meeting your treatment goals. ? 4 times a year, if you are not meeting your treatment goals or if treatment goals have changed.  Lipid testing, including total, LDL, and HDL cholesterol and triglyceride levels. ? The goal for LDL is less than 100 mg/dL (5.5 mmol/L). If you are at high risk for complications, the goal is less than 70 mg/dL (3.9 mmol/L). ? The goal for HDL is 40 mg/dL (2.2 mmol/L) or higher for men and 50 mg/dL (2.8 mmol/L) or higher for women. An HDL cholesterol of 60 mg/dL (3.3 mmol/L) or higher gives some protection against heart disease. ? The goal for triglycerides is less than 150 mg/dL (8.3 mmol/L).  Liver function tests.  Kidney function tests.  Thyroid function tests. Dental and eye exams  Visit your dentist two times a year.  If you have type 1 diabetes, your health care provider may recommend an eye exam 3-5 years after you are diagnosed, and then once a year after your first exam. ? For children with type 1 diabetes, a health care provider may recommend an eye exam when your child is age 10 or older and has had diabetes for 3-5 years. After the first exam, your child should get an eye exam once a year.  If you have type 2 diabetes, your health care provider may recommend an eye exam as soon as you are diagnosed, and then once a year after your first exam. Immunizations   The  yearly flu (influenza) vaccine is recommended for everyone 6 months or older who has diabetes.  The pneumonia (pneumococcal) vaccine is recommended for everyone 2 years or older who has diabetes. If you are 65 or older, you may get the pneumonia vaccine as a series of two separate shots.  The hepatitis B vaccine is recommended for adults shortly after being diagnosed with  diabetes.  Adults and children with diabetes should receive all other vaccines according to age-specific recommendations from the Centers for Disease Control and Prevention (CDC). Mental and emotional health Screening for symptoms of eating disorders, anxiety, and depression is recommended at the time of diagnosis and afterward as needed. If your screening shows that you have symptoms (positive screening result), you may need more evaluation and you may work with a mental health care provider. Treatment plan Your treatment plan will be reviewed at every medical visit. You and your health care provider will discuss:  How you are taking your medicines, including insulin.  Any side effects you are experiencing.  Your blood glucose target goals.  The frequency of your blood glucose monitoring.  Lifestyle habits, such as activity level as well as tobacco, alcohol, and substance use. Diabetes self-management education Your health care provider will assess how well you are monitoring your blood glucose levels and whether you are taking your insulin correctly. He or she may refer you to:  A certified diabetes educator to manage your diabetes throughout your life, starting at diagnosis.  A registered dietitian who can create or review your personal nutrition plan.  An exercise specialist who can discuss your activity level and exercise plan. Summary  Managing diabetes (diabetes mellitus) can be complicated. Your diabetes treatment may be managed by a team of health care providers.  Your health care providers follow guidelines in order to help you get the best quality of care.  Standards of care including having regular physical exams, blood tests, blood pressure monitoring, immunizations, screening tests, and education about how to manage your diabetes.  Your health care providers may also give you more specific instructions based on your individual health. This information is not intended  to replace advice given to you by your health care provider. Make sure you discuss any questions you have with your health care provider. Document Revised: 09/20/2017 Document Reviewed: 09/30/2015 Elsevier Patient Education  2020 [employer]  

## 2019-02-26 NOTE — Assessment & Plan Note (Signed)
BP at goal on hctz and irbesartan. Recent BMP without indication for change.

## 2019-03-12 ENCOUNTER — Other Ambulatory Visit: Payer: Self-pay | Admitting: Internal Medicine

## 2019-03-13 ENCOUNTER — Other Ambulatory Visit: Payer: Self-pay | Admitting: Internal Medicine

## 2019-03-20 ENCOUNTER — Encounter: Payer: Self-pay | Admitting: Internal Medicine

## 2019-03-20 DIAGNOSIS — Z79899 Other long term (current) drug therapy: Secondary | ICD-10-CM | POA: Diagnosis not present

## 2019-03-20 DIAGNOSIS — M25571 Pain in right ankle and joints of right foot: Secondary | ICD-10-CM | POA: Diagnosis not present

## 2019-03-20 DIAGNOSIS — G894 Chronic pain syndrome: Secondary | ICD-10-CM | POA: Diagnosis not present

## 2019-03-20 DIAGNOSIS — G8929 Other chronic pain: Secondary | ICD-10-CM | POA: Diagnosis not present

## 2019-04-27 ENCOUNTER — Other Ambulatory Visit: Payer: Self-pay | Admitting: Internal Medicine

## 2019-05-08 ENCOUNTER — Other Ambulatory Visit: Payer: Self-pay

## 2019-05-08 ENCOUNTER — Telehealth: Payer: Self-pay

## 2019-05-08 MED ORDER — MIRTAZAPINE 45 MG PO TABS: 45.0000 mg | ORAL_TABLET | Freq: Every day | ORAL | 3 refills | Status: DC

## 2019-05-08 NOTE — Telephone Encounter (Signed)
1.Medication Requested:mirtazapine (REMERON) 45 MG tablet  2. Pharmacy (Name, [facility], [place], [place]  3. On Med List: Yes   4. Last Visit with PCP: 2.11.2021   5. Next visit date with PCP: no appt is made at this time    Agent: Please be advised that RX refills may take up to 3 business days. We ask that you follow-up with your pharmacy.

## 2019-05-08 NOTE — Telephone Encounter (Signed)
Refills sent to [facility] 

## 2019-05-20 DIAGNOSIS — G894 Chronic pain syndrome: Secondary | ICD-10-CM | POA: Diagnosis not present

## 2019-05-20 DIAGNOSIS — J449 Chronic obstructive pulmonary disease, unspecified: Secondary | ICD-10-CM | POA: Diagnosis not present

## 2019-05-20 DIAGNOSIS — F1120 Opioid dependence, uncomplicated: Secondary | ICD-10-CM | POA: Diagnosis not present

## 2019-05-20 DIAGNOSIS — Z79899 Other long term (current) drug therapy: Secondary | ICD-10-CM | POA: Diagnosis not present

## 2019-05-20 DIAGNOSIS — G629 Polyneuropathy, unspecified: Secondary | ICD-10-CM | POA: Diagnosis not present

## 2019-06-23 ENCOUNTER — Ambulatory Visit (INDEPENDENT_AMBULATORY_CARE_PROVIDER_SITE_OTHER): Payer: Medicare HMO | Admitting: Internal Medicine

## 2019-06-23 ENCOUNTER — Encounter: Payer: Self-pay | Admitting: Internal Medicine

## 2019-06-23 ENCOUNTER — Other Ambulatory Visit: Payer: Self-pay

## 2019-06-23 VITALS — BP 124/80 | HR 85 | Temp 99.1°F | Ht 70.0 in | Wt 273.0 lb

## 2019-06-23 DIAGNOSIS — E785 Hyperlipidemia, unspecified: Secondary | ICD-10-CM

## 2019-06-23 DIAGNOSIS — M79671 Pain in right foot: Secondary | ICD-10-CM | POA: Diagnosis not present

## 2019-06-23 DIAGNOSIS — E1169 Type 2 diabetes mellitus with other specified complication: Secondary | ICD-10-CM | POA: Diagnosis not present

## 2019-06-23 DIAGNOSIS — M79672 Pain in left foot: Secondary | ICD-10-CM

## 2019-06-23 LAB — LIPID PANEL
Cholesterol: 153 mg/dL (ref 0–200)
HDL: 48.8 mg/dL (ref >39.00)
LDL Cholesterol: 89 mg/dL (ref 0–99)
NonHDL: 104.05
Total CHOL/HDL Ratio: 3
Triglycerides: 77 mg/dL (ref 0.0–149.0)
VLDL: 15.4 mg/dL (ref 0.0–40.0)

## 2019-06-23 LAB — HEMOGLOBIN A1C: Hgb A1c MFr Bld: 6.9 % — ABNORMAL HIGH (ref 4.6–6.5)

## 2019-06-23 NOTE — Assessment & Plan Note (Signed)
Checking lipid panel and adjust pravastatin 10 mg daily if needed.

## 2019-06-23 NOTE — Progress Notes (Signed)
   Subjective:   Patient ID: Douglas Robinson, male    DOB: 19-Mar-1960, 59 y.o.   MRN: [id number]  HPI The patient is a 59 YO man coming in for follow up diabetes (still taking acarbose, glipizide, januvia, jardiance, metformin, on ARB and statin, diet has been slightly worse in the last month or so but getting back on track) and foot pain (bilateral and worsening, wants to see a foot specialist about this, does have chronic pain in his feet and sees pain management for his hydrocodone for this, overall worse in the last few weeks/months) and cholesterol (taking pravastatin daily, denies side effects, denies chest pains or stroke symptoms).   Review of Systems  Constitutional: Negative.   HENT: Negative.   Eyes: Negative.   Respiratory: Negative for cough, chest tightness and shortness of breath.   Cardiovascular: Negative for chest pain, palpitations and leg swelling.  Gastrointestinal: Negative for abdominal distention, abdominal pain, constipation, diarrhea, nausea and vomiting.  Musculoskeletal: Positive for arthralgias, joint swelling and myalgias.  Skin: Negative.   Neurological: Negative.   Psychiatric/Behavioral: Negative.     Objective:  Physical Exam Constitutional:      Appearance: He is well-developed. He is obese.  HENT:     Head: Normocephalic and atraumatic.  Cardiovascular:     Rate and Rhythm: Normal rate and regular rhythm.  Pulmonary:     Effort: Pulmonary effort is normal. No respiratory distress.     Breath sounds: Normal breath sounds. No wheezing or rales.  Abdominal:     General: Bowel sounds are normal. There is no distension.     Palpations: Abdomen is soft.     Tenderness: There is no abdominal tenderness. There is no rebound.  Musculoskeletal:        General: Tenderness present.     Cervical back: Normal range of motion.     Comments: Pain in both feet, some swelling, shoes appropriate in fit and type  Skin:    General: Skin is warm and dry.   Neurological:     Mental Status: He is alert and oriented to person, place, and time.     Coordination: Coordination normal.     Vitals:   06/23/19 0801  BP: 124/80  Pulse: 85  Temp: 99.1 F (37.3 C)  TempSrc: Oral  SpO2: 96%  Weight: 273 lb (123.8 kg)  Height: 5\' 10"  (1.778 m)    This visit occurred during the SARS-CoV-2 public health emergency.  Safety protocols were in place, including screening questions prior to the visit, additional usage of staff PPE, and extensive cleaning of exam room while observing appropriate contact time as indicated for disinfecting solutions.   Assessment & Plan:

## 2019-06-23 NOTE — Assessment & Plan Note (Signed)
Checking HgA1c and adjust acarbose, januvia, jardiance, glipizide, metformin as needed. On ARB and statin. Complicated by hyperlipidemia.

## 2019-06-23 NOTE — Assessment & Plan Note (Signed)
Referral to podiatry  

## 2019-06-26 ENCOUNTER — Telehealth: Payer: Self-pay | Admitting: Internal Medicine

## 2019-06-26 NOTE — Telephone Encounter (Signed)
Patient's wife informed of lab results.

## 2019-06-26 NOTE — Telephone Encounter (Signed)
New message:   Pt is returning a call for lab results. Please advise.

## 2019-07-02 ENCOUNTER — Ambulatory Visit: Payer: Medicare HMO | Admitting: Podiatrist

## 2019-07-02 ENCOUNTER — Other Ambulatory Visit: Payer: Self-pay

## 2019-07-02 ENCOUNTER — Encounter: Payer: Self-pay | Admitting: Podiatrist

## 2019-07-02 ENCOUNTER — Ambulatory Visit (INDEPENDENT_AMBULATORY_CARE_PROVIDER_SITE_OTHER): Payer: Medicare HMO

## 2019-07-02 VITALS — Temp 97.3°F

## 2019-07-02 DIAGNOSIS — M76822 Posterior tibial tendinitis, left leg: Secondary | ICD-10-CM

## 2019-07-02 DIAGNOSIS — M79672 Pain in left foot: Secondary | ICD-10-CM

## 2019-07-02 DIAGNOSIS — M19071 Primary osteoarthritis, right ankle and foot: Secondary | ICD-10-CM

## 2019-07-02 DIAGNOSIS — M19072 Primary osteoarthritis, left ankle and foot: Secondary | ICD-10-CM | POA: Diagnosis not present

## 2019-07-02 DIAGNOSIS — M79671 Pain in right foot: Secondary | ICD-10-CM

## 2019-07-02 NOTE — Patient Instructions (Addendum)
[facility] has some nice over the counter inserts with good arch support-  They also carrie some good running shoes and hiking shoes which would also be beneficial to help support the arch of your foot.  [facility]-  [address], [address]  [place], [place]  [address]   Also, there is an over the counter gel that helps-  It is called VOLTAREN Gel  -  You may try massaging this into your arch to help with some of the discomfort    Posterior Tibial Tendinitis Posterior tibial tendinitis is irritation of a tendon called the posterior tibial tendon. Your posterior tibial tendon is a cord-like tissue that connects bones of your lower leg and foot to a muscle that:  Supports your arch.  Helps you raise up on your toes.  Helps you turn your foot down and in. This condition causes foot and ankle pain. It can also lead to a flat foot. What are the causes? This condition is most often caused by repeated stress to the tendon (overuse injury). It can also be caused by a sudden injury that stresses the tendon, such as landing on your foot after jumping or falling. What increases the risk? This condition is more likely to develop in:  People who play a sport that involves putting a lot of pressure on the feet, such as: ? Basketball. ? Tennis. ? Soccer. ? Hockey.  Runners.  Females who are older than 61 years of age and are overweight.  People with diabetes.  People with decreased foot stability.  People with flat feet. What are the signs or symptoms? Symptoms include:  Pain in the inner ankle.  Pain at the arch of your foot.  Pain that gets worse with running, walking, or standing.  Swelling on the inside of your ankle and foot.  Weakness in your ankle or foot.  Inability to stand up on tiptoe.  Flattening of the arch of your foot. How is this diagnosed? This condition may be diagnosed based on:  Your symptoms.  Your medical history.  A physical exam.  Tests, such  as: ? X-ray. ? MRI. ? Ultrasound. How is this treated? This condition may be treated by:  Putting ice to the injured area.  Taking NSAIDs, such as ibuprofen, to reduce pain and swelling.  Wearing a special shoe or shoe insert to support your arch (orthotic).  Having physical therapy.  Replacing high-impact exercise with low-impact exercise, such as swimming or cycling. If your symptoms do not improve with these treatments, you may need to wear a splint, removable walking boot, or short leg cast for 6-8 weeks to keep your foot and ankle still (immobilized). Follow these instructions at home: If you have a cast, splint, or boot:  Keep it clean and dry.  Check the skin around it every day. Tell your health care provider about any concerns. If you have a cast:  Do not stick anything inside it to scratch your skin. Doing that increases your risk of infection.  You may put lotion on dry skin around the edges of the cast. Do not put lotion on the skin underneath the cast. If you have a splint or boot:  Wear it as told by your health care provider. Remove it only as told by your health care provider.  Loosen it if your toes tingle, become numb, or turn cold and blue. Bathing  Do not take baths, swim, or use a hot tub until your health care provider  approves. Ask your health care provider if you may take showers.  If your cast, splint, or boot is not waterproof: ? Do not let it get wet. ? Cover it with a waterproof covering while you take a bath or a shower. Managing pain and swelling   If directed, put ice on the injured area. ? If you have a removable splint or boot, remove it as told by your health care provider. ? Put ice in a plastic bag. ? Place a towel between your skin and the bag or between your cast and the bag. ? Leave the ice on for 20 minutes, 2-3 times a day.  Move your toes often to reduce stiffness and swelling.  Raise (elevate) the injured area above the  level of your heart while you are sitting or lying down. Activity  Do not use the injured foot to support your body weight until your health care provider says that you can. Use crutches as told by your health care provider.  Do not do activities that make pain or swelling worse.  Ask your health care provider when it is safe to drive if you have a cast, splint, or boot on your foot.  Return to your normal activities as told by your health care provider. Ask your health care provider what activities are safe for you.  Do exercises as told by your health care provider. General instructions  Take over-the-counter and prescription medicines only as told by your health care provider.  If you have an orthotic, use it as told by your health care provider.  Keep all follow-up visits as told by your health care provider. This is important. How is this prevented?  Wear footwear that is appropriate to your athletic activity.  Avoid athletic activities that cause pain or swelling in your ankle or foot.  Before being active, do range-of-motion and stretching exercises.  If you develop pain or swelling while training, stop training.  If you have pain or swelling that does not improve after a few days of rest, see your health care provider.  If you start a new athletic activity, start gradually so you can build up your strength and flexibility. Contact a health care provider if:  Your symptoms get worse.  Your symptoms do not improve in 6-8 weeks.  You develop new, unexplained symptoms.  Your splint, boot, or cast gets damaged. Summary  Posterior tibial tendinitis is irritation of a tendon called the posterior tibial tendon.  This condition is most often caused by repeated stress to the tendon (overuse injury).  This condition causes foot pain and ankle pain. It can also lead to a flat foot.  This condition may be treated by not doing high-impact activities, applying ice, having  physical therapy, wearing orthotics, and wearing a cast, splint, or boot if needed. This information is not intended to replace advice given to you by your health care provider. Make sure you discuss any questions you have with your health care provider. Document Revised: 04/29/2018 Document Reviewed: 03/06/2018 Elsevier Patient Education  [place].

## 2019-07-02 NOTE — Progress Notes (Signed)
  Chief Complaint  Patient presents with  . Foot Problem    Bilateral dorsal midfoot pain - since 1998. Pt stated, "Started when I tore a ligament in my R leg. I compensated with the other foot/leg. Pain = 7-8/10 most of the time - cold/rainy weather triggers it".  . Diabetes    Most recent A1c per pt = 6.9. No known history of neuropathy or diabetic ulcers.     HPI: Patient is 59 y.o. male who presents today for the concerns as listed above.  Patient relates he sustained an injury to the right foot where his tendons were torn and he had to have surgery.  He relates he has had pain ever since surgery several years ago on the right foot.  Today the patient states his pain is present on the left foot at the dorsal midfoot and medial arch.   Review of Systems No fevers, chills, nausea, muscle aches, no difficulty breathing, no calf pain, no chest pain or shortness of breath.   Physical Exam  GENERAL APPEARANCE: Alert, conversant. Appropriately groomed. No acute distress.   VASCULAR: Pedal pulses palpable DP and PT bilateral.  Capillary refill time is immediate to all digits,  Proximal to distal cooling it warm to warm.  Digital hair growth is present bilateral   NEUROLOGIC: sensation is intact epicritically and protectively to 5.07 monofilament at 5/5 sites bilateral.  Light touch is intact bilateral, vibratory sensation intact bilateral, achilles tendon reflex is intact bilateral.   MUSCULOSKELETAL: acceptable muscle strength, tone and stability bilateral.  No gross boney pedal deformities noted.  Limitation noted at ankle and first metatarsal joint range of motion on the right.  Pain on palpation along the posterior tibial tendon on the left is noted just proximal to and at its insertion site.  Moderate discomfort on the dorsal midfoot is also palpated.  DERMATOLOGIC: skin is warm, supple, and dry.  No open lesions noted.  No rash, no pre ulcerative lesions. Digital nails are asymptomatic.     X-ray evaluation 3 views of bilateral feet are obtained.  Arthritic changes noted at the subtalar joint and CN joint is noted on the right foot.  Left foot has some arthritic spurring in the midfoot as well as well as some arthritic subtalar joint changes as well.  No acute osseous abnormalities are identified.   Assessment     ICD-10-CM   1. Bilateral foot pain  M79.671 DG Foot Complete Right   M79.672 DG Foot Complete Left  2. Posterior tibial tendon dysfunction (PTTD) of left lower extremity  M76.822   3. Arthritis of midtarsal joint of left foot  M19.072      Plan Discussed treatment options and alternatives.  Discussed my recommendation for a orthotic device.  Due to this being an out-of-pocket expense for him he would like to try over-the-counter inserts first.  A power step insert set was dispensed for his use.  He will also try Voltaren gel.  And will return if symptoms do not resolve.

## 2019-07-21 DIAGNOSIS — Z79899 Other long term (current) drug therapy: Secondary | ICD-10-CM | POA: Diagnosis not present

## 2019-07-21 DIAGNOSIS — G894 Chronic pain syndrome: Secondary | ICD-10-CM | POA: Diagnosis not present

## 2019-07-21 DIAGNOSIS — G629 Polyneuropathy, unspecified: Secondary | ICD-10-CM | POA: Diagnosis not present

## 2019-07-24 ENCOUNTER — Telehealth: Payer: Self-pay | Admitting: Internal Medicine

## 2019-07-24 MED ORDER — EMPAGLIFLOZIN 25 MG PO TABS: 25.0000 mg | ORAL_TABLET | Freq: Every day | ORAL | 1 refills | Status: DC

## 2019-07-24 NOTE — Telephone Encounter (Signed)
JARDIANCE 25 MG TABS tablet  [place], [place] Phone:  [phone]  Fax:  [phone]     Douglas Robinson also provided the following number for [facility] to send electronically: [phone]  Last visit: 6.8.21  Next visit: no scheduled at this time AWV on 7.28.21

## 2019-08-06 ENCOUNTER — Other Ambulatory Visit: Payer: Self-pay | Admitting: Podiatrist

## 2019-08-06 DIAGNOSIS — M19071 Primary osteoarthritis, right ankle and foot: Secondary | ICD-10-CM

## 2019-08-06 DIAGNOSIS — M19072 Primary osteoarthritis, left ankle and foot: Secondary | ICD-10-CM

## 2019-08-12 ENCOUNTER — Ambulatory Visit (INDEPENDENT_AMBULATORY_CARE_PROVIDER_SITE_OTHER): Payer: Medicare HMO

## 2019-08-12 DIAGNOSIS — Z0000 Encounter for general adult medical examination without abnormal findings: Secondary | ICD-10-CM

## 2019-08-12 NOTE — Progress Notes (Signed)
Subjective:   Douglas Robinson is a 59 y.o. male who presents for Medicare Annual/Subsequent preventive examination.  Review of Systems    No ROS. Medicare Wellness Visit Cardiac Risk Factors include: advanced age (>78men, >66 women);diabetes mellitus;dyslipidemia;hypertension;male gender;obesity (BMI >30kg/m2)     Objective:    There were no vitals filed for this visit. There is no height or weight on file to calculate BMI.  Advanced Directives 08/12/2019 08/06/2018 12/27/2014 12/14/2014 07/29/2013 07/29/2013  Does Patient Have a Medical Advance Directive? No No No No Patient would like information Patient does not have advance directive;Patient would not like information  Would patient like information on creating a medical advance directive? Yes (MAU/Ambulatory/Procedural Areas - Information given) Yes (ED - Information included in AVS) - - Advance directive brochure given (Outpatient ONLY) -    Current Medications (verified) Outpatient Encounter Medications as of 08/12/2019  Medication Sig  . acarbose (PRECOSE) 50 MG tablet TAKE 1 TABLET THREE TIMES DAILY WITH MEALS  . ACCU-CHEK AVIVA PLUS test strip TEST BLOOD SUGAR TWICE DAILY  . albuterol (VENTOLIN HFA) 108 (90 Base) MCG/ACT inhaler INHALE 1 TO 2 PUFFS EVERY 6 HOURS AS NEEDED FOR WHEEZING  OR FOR SHORTNESS OF BREATH  . aspirin 81 MG tablet Take 81 mg by mouth daily.  . empagliflozin (JARDIANCE) 25 MG TABS tablet Take 1 tablet (25 mg total) by mouth daily.  Marland Kitchen glipiZIDE (GLUCOTROL XL) 10 MG 24 hr tablet TAKE 1 TABLET EVERY DAY  WITH  BREAKFAST  . hydrochlorothiazide (HYDRODIURIL) 25 MG tablet TAKE 1 TABLET EVERY DAY  . HYDROcodone-acetaminophen (NORCO) 10-325 MG per tablet Take 1 tablet by mouth every 6 (six) hours as needed.  . irbesartan (AVAPRO) 300 MG tablet TAKE 1 TABLET EVERY DAY  . JANUVIA 100 MG tablet TAKE 1 TABLET EVERY DAY  . metFORMIN (GLUCOPHAGE) 1000 MG tablet Take 1 tablet (1,000 mg total) by mouth 2 (two) times  daily with a meal.  . mirtazapine (REMERON) 45 MG tablet Take 1 tablet (45 mg total) by mouth at bedtime.  . Multiple Vitamin (MULTIVITAMIN) tablet Take 1 tablet by mouth daily.  . pantoprazole (PROTONIX) 20 MG tablet TAKE 1 TABLET EVERY DAY  . pravastatin (PRAVACHOL) 10 MG tablet TAKE 1 TABLET EVERY DAY  . PRODIGY TWIST TOP LANCETS 28G MISC Use one lancet at each glucose check, as needed.   No facility-administered encounter medications on file as of 08/12/2019.    Allergies (verified) Naproxen   History: Past Medical History:  Diagnosis Date  . Anxiety   . COPD (chronic obstructive pulmonary disease) ([place])   . Diabetes ([place])   . GERD (gastroesophageal reflux disease)   . HTN (hypertension)   . Hyperlipidemia   . OSA on CPAP   . Sleep apnea    Past Surgical History:  Procedure Laterality Date  . CARPAL TUNNEL RELEASE Right   . COLONOSCOPY  2013  . HERNIA REPAIR    . POLYPECTOMY  2013   4 polyps-TA  . REPAIR ANKLE LIGAMENT  1995   "Sometime before 1996"   Family History  Problem Relation Age of Onset  . Cancer - Prostate Father   . Colon polyps Father   . Colon cancer Neg Hx   . Rectal cancer Neg Hx   . Stomach cancer Neg Hx    Social History   Socioeconomic History  . Marital status: Married    Spouse name: Not on file  . Number of children: Not on file  . Years of  education: Not on file  . Highest education level: Not on file  Occupational History  . Occupation: disabilty  Tobacco Use  . Smoking status: Never Smoker  . Smokeless tobacco: Never Used  Vaping Use  . Vaping Use: Never used  Substance and Sexual Activity  . Alcohol use: No    Alcohol/week: 0.0 standard drinks  . Drug use: No  . Sexual activity: Yes  Other Topics Concern  . Not on file  Social History Narrative  . Not on file   Social Determinants of Health   Financial Resource Strain: Medium Risk  . Difficulty of Paying Living Expenses: Somewhat hard  Food Insecurity: Food  Insecurity Present  . Worried About Charity fundraiser in the Last Year: Sometimes true  . Ran Out of Food in the Last Year: Sometimes true  Transportation Needs: No Transportation Needs  . Lack of Transportation (Medical): No  . Lack of Transportation (Non-Medical): No  Physical Activity:   . Days of Exercise per Week:   . Minutes of Exercise per Session:   Stress: No Stress Concern Present  . Feeling of Stress : Not at all  Social Connections:   . Frequency of Communication with Friends and Family:   . Frequency of Social Gatherings with Friends and Family:   . Attends Religious Services:   . Active Member of Clubs or Organizations:   . Attends Archivist Meetings:   Marland Kitchen Marital Status:     Tobacco Counseling Counseling given: Not Answered   Clinical Intake:  Pre-visit preparation completed: Yes  Pain : No/denies pain     Nutritional Risks: None Diabetes: Yes CBG done?: No Did pt. bring in CBG monitor from home?: No  How often do you need to have someone help you when you read instructions, pamphlets, or other written materials from your doctor or pharmacy?: 1 - Never What is the last grade level you completed in school?: High School Graduate  Diabetic? yes  Interpreter Needed?: No  Information entered by :: Marc Sivertsen N. Welda Azzarello, LPN   Activities of Daily Living In your present state of health, do you have any difficulty performing the following activities: 08/12/2019  Hearing? N  Vision? N  Difficulty concentrating or making decisions? N  Walking or climbing stairs? N  Dressing or bathing? N  Doing errands, shopping? N  Preparing Food and eating ? N  Using the Toilet? N  In the past six months, have you accidently leaked urine? N  Do you have problems with loss of bowel control? N  Managing your Medications? N  Managing your Finances? N  Housekeeping or managing your Housekeeping? N  Some recent data might be hidden    Patient Care  Team: Hoyt Koch, MD as PCP - General (Internal Medicine) Milus Banister, MD as Attending Physician (Gastroenterology) Rutherford Guys, MD as Consulting Physician (Ophthalmology) Jeanella Anton, NP as Nurse Practitioner (Nurse Practitioner)  Indicate any recent Medical Services you may have received from other than [facility] providers in the past year (date may be approximate).     Assessment:   This is a routine wellness examination for Adriaan.  Hearing/Vision screen No exam data present  Dietary issues and exercise activities discussed: Current Exercise Habits: Home exercise routine, Type of exercise: Other - see comments (stretch bands), Time (Minutes): 30, Frequency (Times/Week): 5, Weekly Exercise (Minutes/Week): 150, Intensity: Moderate, Exercise limited by: orthopedic condition(s) (chronic pain right foot)  Goals    .  Patient Stated (pt-stated)  I'm ready to go back to the gym       Depression Screen PHQ 2/9 Scores 08/12/2019 08/06/2018 09/30/2017 08/29/2017 07/29/2013  PHQ - 2 Score 0 1 3 1  0  PHQ- 9 Score - 3 12 - -    Fall Risk Fall Risk  08/12/2019 08/06/2018 07/29/2013  Falls in the past year? 0 0 No  Number falls in past yr: 0 0 -  Injury with Fall? 0 - -  Risk for fall due to : No Fall Risks Impaired mobility;Impaired balance/gait -  Follow up Falls evaluation completed Falls prevention discussed -    Any stairs in or around the home? Yes  If so, are there any without handrails? No  Home free of loose throw rugs in walkways, pet beds, electrical cords, etc? Yes  Adequate lighting in your home to reduce risk of falls? Yes   ASSISTIVE DEVICES UTILIZED TO PREVENT FALLS:  Life alert? No  Use of a cane, walker or w/c? No  Grab bars in the bathroom? No  Shower chair or bench in shower? No  Elevated toilet seat or a handicapped toilet? No   TIMED UP AND GO:  Was the test performed? No .  Length of time to ambulate 10 feet: 0 sec.   Gait slow and  steady without use of assistive device  Cognitive Function:        Immunizations Immunization History  Administered Date(s) Administered  . Fluad Quad(high Dose 65+) 10/10/2018  . Influenza,inj,Quad PF,6+ Mos 09/25/2017, 09/23/2018  . Influenza-Unspecified 09/23/2017  . Moderna SARS-COVID-2 Vaccination 05/19/2019, 06/16/2019  . Pneumococcal Polysaccharide-23 09/13/2011  . Tdap 11/06/2017    TDAP status: Up to date Flu Vaccine status: Up to date Pneumococcal vaccine status: Up to date Covid-19 vaccine status: Completed vaccines  Qualifies for Shingles Vaccine? Yes   Zostavax completed Yes   Shingrix Completed?: No.    Education has been provided regarding the importance of this vaccine. Patient has been advised to call insurance company to determine out of pocket expense if they have not yet received this vaccine. Advised may also receive vaccine at local pharmacy or Health Dept. Verbalized acceptance and understanding.  Screening Tests Health Maintenance  Topic Date Due  . HIV Screening  Never done  . FOOT EXAM  07/25/2019  . INFLUENZA VACCINE  08/16/2019  . OPHTHALMOLOGY EXAM  10/27/2019  . HEMOGLOBIN A1C  12/23/2019  . COLONOSCOPY  12/27/2019  . TETANUS/TDAP  11/07/2027  . PNEUMOCOCCAL POLYSACCHARIDE VACCINE AGE 83-64 HIGH RISK  Completed  . COVID-19 Vaccine  Completed  . Hepatitis C Screening  Completed    Health Maintenance  Health Maintenance Due  Topic Date Due  . HIV Screening  Never done  . FOOT EXAM  07/25/2019    Colorectal cancer screening: Completed 12/27/2014. Repeat every 5 years  Lung Cancer Screening: (Low Dose CT Chest recommended if Age 66-80 years, 30 pack-year currently smoking OR have quit w/in 15years.) does not qualify.   Lung Cancer Screening Referral: no  Additional Screening:  Hepatitis C Screening: does qualify; Completed yes  Vision Screening: Recommended annual ophthalmology exams for early detection of glaucoma and other  disorders of the eye. Is the patient up to date with their annual eye exam?  Yes  Who is the provider or what is the name of the office in which the patient attends annual eye exams? [facility] If pt is not established with a provider, would they like to be referred to a provider  to establish care? No .   Dental Screening: Recommended annual dental exams for proper oral hygiene  Community Resource Referral / Chronic Care Management: CRR required this visit?  Yes  CCM required this visit?  No      Plan:     I have personally reviewed and noted the following in the patient's chart:   . Medical and social history . Use of alcohol, tobacco or illicit drugs  . Current medications and supplements . Functional ability and status . Nutritional status . Physical activity . Advanced directives . List of other physicians . Hospitalizations, surgeries, and ER visits in previous 12 months . Vitals . Screenings to include cognitive, depression, and falls . Referrals and appointments  In addition, I have reviewed and discussed with patient certain preventive protocols, quality metrics, and best practice recommendations. A written personalized care plan for preventive services as well as general preventive health recommendations were provided to patient.     Sheral Flow, LPN   [id number]/50/0370   Nurse Notes:  Patient is cogitatively intact. There were no vitals filed for this visit. There is no height or weight on file to calculate BMI. Patient uses a cpap for assistive devices. Patient is in need of referral to [facility] due to financial strain and food insecurity. Order has been placed.

## 2019-08-12 NOTE — Patient Instructions (Addendum)
Douglas Robinson , Thank you for taking time to come for your Medicare Wellness Visit. I appreciate your ongoing commitment to your health goals. Please review the following plan we discussed and let me know if I can assist you in the future.   Screening recommendations/referrals: Colonoscopy: 12/27/2014; due every 5 years Recommended yearly ophthalmology/optometry visit for glaucoma screening and checkup Recommended yearly dental visit for hygiene and checkup  Vaccinations: Influenza vaccine: 10/10/2018 Pneumococcal vaccine: completed Tdap vaccine: 11/06/2017 Shingles vaccine: never done; will check with local pharmacy   Covid-19: Moderna completed  Advanced directives: Advance directive discussed with you today. I have provided a copy for you to complete at home and have notarized. Once this is complete please bring a copy in to our office so we can scan it into your chart.  Conditions/risks identified: Yes; Please continue to do your personal lifestyle choices by: daily care of teeth and gums, regular physical activity (goal should be 5 days a week for 30 minutes), eat a healthy diet, avoid tobacco and drug use, limiting any alcohol intake, taking a low-dose aspirin (if not allergic or have been advised by your provider otherwise) and taking vitamins and minerals as recommended by your provider. Continue doing brain stimulating activities (puzzles, reading, adult coloring books, staying active) to keep memory sharp. Continue to eat heart healthy diet (full of fruits, vegetables, whole grains, lean protein, water--limit salt, fat, and sugar intake) and increase physical activity as tolerated.  Next appointment: Please schedule your next Medicare Wellness Visit with your Nurse Health Advisor in 1 year.  Preventive Care 61 Years and Older, Male Preventive care refers to lifestyle choices and visits with your health care provider that can promote health and wellness. What does preventive care  include?  A yearly physical exam. This is also called an annual well check.  Dental exams once or twice a year.  Routine eye exams. Ask your health care provider how often you should have your eyes checked.  Personal lifestyle choices, including:  Daily care of your teeth and gums.  Regular physical activity.  Eating a healthy diet.  Avoiding tobacco and drug use.  Limiting alcohol use.  Practicing safe sex.  Taking low doses of aspirin every day.  Taking vitamin and mineral supplements as recommended by your health care provider. What happens during an annual well check? The services and screenings done by your health care provider during your annual well check will depend on your age, overall health, lifestyle risk factors, and family history of disease. Counseling  Your health care provider may ask you questions about your:  Alcohol use.  Tobacco use.  Drug use.  Emotional well-being.  Home and relationship well-being.  Sexual activity.  Eating habits.  History of falls.  Memory and ability to understand (cognition).  Work and work Statistician. Screening  You may have the following tests or measurements:  Height, weight, and BMI.  Blood pressure.  Lipid and cholesterol levels. These may be checked every 5 years, or more frequently if you are over 41 years old.  Skin check.  Lung cancer screening. You may have this screening every year starting at age 26 if you have a 30-pack-year history of smoking and currently smoke or have quit within the past 15 years.  Fecal occult blood test (FOBT) of the stool. You may have this test every year starting at age 58.  Flexible sigmoidoscopy or colonoscopy. You may have a sigmoidoscopy every 5 years or a colonoscopy every 10 years  starting at age 2.  Prostate cancer screening. Recommendations will vary depending on your family history and other risks.  Hepatitis C blood test.  Hepatitis B blood  test.  Sexually transmitted disease (STD) testing.  Diabetes screening. This is done by checking your blood sugar (glucose) after you have not eaten for a while (fasting). You may have this done every 1-3 years.  Abdominal aortic aneurysm (AAA) screening. You may need this if you are a current or former smoker.  Osteoporosis. You may be screened starting at age 21 if you are at high risk. Talk with your health care provider about your test results, treatment options, and if necessary, the need for more tests. Vaccines  Your health care provider may recommend certain vaccines, such as:  Influenza vaccine. This is recommended every year.  Tetanus, diphtheria, and acellular pertussis (Tdap, Td) vaccine. You may need a Td booster every 10 years.  Zoster vaccine. You may need this after age 30.  Pneumococcal 13-valent conjugate (PCV13) vaccine. One dose is recommended after age 80.  Pneumococcal polysaccharide (PPSV23) vaccine. One dose is recommended after age 77. Talk to your health care provider about which screenings and vaccines you need and how often you need them. This information is not intended to replace advice given to you by your health care provider. Make sure you discuss any questions you have with your health care provider. Document Released: 01/28/2015 Document Revised: 09/21/2015 Document Reviewed: 11/02/2014 Elsevier Interactive Patient Education  2017 [employer] Prevention in the Home Falls can cause injuries. They can happen to people of all ages. There are many things you can do to make your home safe and to help prevent falls. What can I do on the outside of my home?  Regularly fix the edges of walkways and driveways and fix any cracks.  Remove anything that might make you trip as you walk through a door, such as a raised step or threshold.  Trim any bushes or trees on the path to your home.  Use bright outdoor lighting.  Clear any walking paths of  anything that might make someone trip, such as rocks or tools.  Regularly check to see if handrails are loose or broken. Make sure that both sides of any steps have handrails.  Any raised decks and porches should have guardrails on the edges.  Have any leaves, snow, or ice cleared regularly.  Use sand or salt on walking paths during winter.  Clean up any spills in your garage right away. This includes oil or grease spills. What can I do in the bathroom?  Use night lights.  Install grab bars by the toilet and in the tub and shower. Do not use towel bars as grab bars.  Use non-skid mats or decals in the tub or shower.  If you need to sit down in the shower, use a plastic, non-slip stool.  Keep the floor dry. Clean up any water that spills on the floor as soon as it happens.  Remove soap buildup in the tub or shower regularly.  Attach bath mats securely with double-sided non-slip rug tape.  Do not have throw rugs and other things on the floor that can make you trip. What can I do in the bedroom?  Use night lights.  Make sure that you have a light by your bed that is easy to reach.  Do not use any sheets or blankets that are too big for your bed. They should not hang down  onto the floor.  Have a firm chair that has side arms. You can use this for support while you get dressed.  Do not have throw rugs and other things on the floor that can make you trip. What can I do in the kitchen?  Clean up any spills right away.  Avoid walking on wet floors.  Keep items that you use a lot in easy-to-reach places.  If you need to reach something above you, use a strong step stool that has a grab bar.  Keep electrical cords out of the way.  Do not use floor polish or wax that makes floors slippery. If you must use wax, use non-skid floor wax.  Do not have throw rugs and other things on the floor that can make you trip. What can I do with my stairs?  Do not leave any items on the  stairs.  Make sure that there are handrails on both sides of the stairs and use them. Fix handrails that are broken or loose. Make sure that handrails are as long as the stairways.  Check any carpeting to make sure that it is firmly attached to the stairs. Fix any carpet that is loose or worn.  Avoid having throw rugs at the top or bottom of the stairs. If you do have throw rugs, attach them to the floor with carpet tape.  Make sure that you have a light switch at the top of the stairs and the bottom of the stairs. If you do not have them, ask someone to add them for you. What else can I do to help prevent falls?  Wear shoes that:  Do not have high heels.  Have rubber bottoms.  Are comfortable and fit you well.  Are closed at the toe. Do not wear sandals.  If you use a stepladder:  Make sure that it is fully opened. Do not climb a closed stepladder.  Make sure that both sides of the stepladder are locked into place.  Ask someone to hold it for you, if possible.  Clearly mark and make sure that you can see:  Any grab bars or handrails.  First and last steps.  Where the edge of each step is.  Use tools that help you move around (mobility aids) if they are needed. These include:  Canes.  Walkers.  Scooters.  Crutches.  Turn on the lights when you go into a dark area. Replace any light bulbs as soon as they burn out.  Set up your furniture so you have a clear path. Avoid moving your furniture around.  If any of your floors are uneven, fix them.  If there are any pets around you, be aware of where they are.  Review your medicines with your doctor. Some medicines can make you feel dizzy. This can increase your chance of falling. Ask your doctor what other things that you can do to help prevent falls. This information is not intended to replace advice given to you by your health care provider. Make sure you discuss any questions you have with your health care  provider. Document Released: 10/28/2008 Document Revised: 06/09/2015 Document Reviewed: 02/05/2014 Elsevier Interactive Patient Education  2017 [employer].

## 2019-08-19 ENCOUNTER — Other Ambulatory Visit: Payer: Self-pay | Admitting: Internal Medicine

## 2019-09-09 ENCOUNTER — Other Ambulatory Visit: Payer: Self-pay | Admitting: Internal Medicine

## 2019-09-17 DIAGNOSIS — G894 Chronic pain syndrome: Secondary | ICD-10-CM | POA: Diagnosis not present

## 2019-09-17 DIAGNOSIS — Z79899 Other long term (current) drug therapy: Secondary | ICD-10-CM | POA: Diagnosis not present

## 2019-09-17 DIAGNOSIS — F1120 Opioid dependence, uncomplicated: Secondary | ICD-10-CM | POA: Diagnosis not present

## 2019-09-17 DIAGNOSIS — G629 Polyneuropathy, unspecified: Secondary | ICD-10-CM | POA: Diagnosis not present

## 2019-09-18 ENCOUNTER — Telehealth: Payer: Self-pay | Admitting: Internal Medicine

## 2019-09-18 NOTE — Progress Notes (Signed)
  Chronic Care Management   Outreach Note  09/18/2019 Name: Douglas Robinson MRN: [id number] DOB: 11-10-1960  Referred by: Hoyt Koch, MD Reason for referral : No chief complaint on file.   An unsuccessful telephone outreach was attempted today. The patient was referred to the pharmacist for assistance with care management and care coordination.   Follow Up Plan:   [name] Upstream Scheduler

## 2019-09-23 ENCOUNTER — Telehealth: Payer: Self-pay | Admitting: Internal Medicine

## 2019-09-23 NOTE — Telephone Encounter (Signed)
   SF 09/23/2019   Name: Douglas Robinson   MRN: [id number]   DOB: 11-20-1960   AGE: 59 y.o.   GENDER: male   PCP Hoyt Koch, MD.   Spoke with Mr. Fitterer and his wife Butch Penny today. Both stated that currently they do not need assistance and that they are aware of the organizations within the [facility] that provide assistance for the community. Informed patient that he can give the office a call if he has any additional needs. Patient stated understanding.   Closing referral pending any other needs of patient.  [place], Care Management Phone: [phone] Email: [email or web address]

## 2019-09-24 ENCOUNTER — Telehealth: Payer: Self-pay | Admitting: Internal Medicine

## 2019-09-24 NOTE — Progress Notes (Signed)
  Chronic Care Management   Outreach Note  09/24/2019 Name: Douglas Robinson MRN: [id number] DOB: 29-Nov-1960  Referred by: Hoyt Koch, MD Reason for referral : No chief complaint on file.   An unsuccessful telephone outreach was attempted today. The patient was referred to the pharmacist for assistance with care management and care coordination.   Follow Up Plan:   [name] Upstream Scheduler

## 2019-09-30 ENCOUNTER — Encounter: Payer: Self-pay | Admitting: Internal Medicine

## 2019-09-30 ENCOUNTER — Other Ambulatory Visit: Payer: Self-pay

## 2019-09-30 ENCOUNTER — Ambulatory Visit (INDEPENDENT_AMBULATORY_CARE_PROVIDER_SITE_OTHER): Payer: Medicare HMO | Admitting: Internal Medicine

## 2019-09-30 VITALS — BP 140/90 | HR 96 | Temp 98.6°F | Ht 70.0 in | Wt 272.0 lb

## 2019-09-30 DIAGNOSIS — I10 Essential (primary) hypertension: Secondary | ICD-10-CM | POA: Diagnosis not present

## 2019-09-30 DIAGNOSIS — E785 Hyperlipidemia, unspecified: Secondary | ICD-10-CM

## 2019-09-30 DIAGNOSIS — E1169 Type 2 diabetes mellitus with other specified complication: Secondary | ICD-10-CM

## 2019-09-30 MED ORDER — EMPAGLIFLOZIN 25 MG PO TABS: 25.0000 mg | ORAL_TABLET | Freq: Every day | ORAL | 1 refills | Status: DC

## 2019-09-30 MED ORDER — PANTOPRAZOLE SODIUM 20 MG PO TBEC
20.0000 mg | DELAYED_RELEASE_TABLET | Freq: Every day | ORAL | 1 refills | Status: DC
Start: 2019-09-30 — End: 2020-05-05

## 2019-09-30 MED ORDER — SITAGLIPTIN PHOSPHATE 100 MG PO TABS
100.0000 mg | ORAL_TABLET | Freq: Every day | ORAL | 1 refills | Status: DC
Start: 2019-09-30 — End: 2020-02-25

## 2019-09-30 MED ORDER — PRAVASTATIN SODIUM 10 MG PO TABS
10.0000 mg | ORAL_TABLET | Freq: Every day | ORAL | 1 refills | Status: DC
Start: 2019-09-30 — End: 2020-04-25

## 2019-09-30 MED ORDER — ALBUTEROL SULFATE HFA 108 (90 BASE) MCG/ACT IN AERS
INHALATION_SPRAY | RESPIRATORY_TRACT | 48 refills | Status: DC
Start: 2019-09-30 — End: 2021-05-16

## 2019-09-30 MED ORDER — ACARBOSE 50 MG PO TABS
ORAL_TABLET | ORAL | 1 refills | Status: DC
Start: 2019-09-30 — End: 2020-09-21

## 2019-09-30 MED ORDER — METFORMIN HCL 1000 MG PO TABS
ORAL_TABLET | ORAL | 1 refills | Status: DC
Start: 2019-09-30 — End: 2020-04-08

## 2019-09-30 MED ORDER — GLIPIZIDE ER 10 MG PO TB24
ORAL_TABLET | ORAL | 1 refills | Status: DC
Start: 2019-09-30 — End: 2020-04-08

## 2019-09-30 MED ORDER — IRBESARTAN 300 MG PO TABS
300.0000 mg | ORAL_TABLET | Freq: Every day | ORAL | 1 refills | Status: DC
Start: 2019-09-30 — End: 2020-04-25

## 2019-09-30 MED ORDER — MIRTAZAPINE 45 MG PO TABS: 45.0000 mg | ORAL_TABLET | Freq: Every day | ORAL | 1 refills | Status: DC

## 2019-09-30 MED ORDER — HYDROCHLOROTHIAZIDE 25 MG PO TABS
25.0000 mg | ORAL_TABLET | Freq: Every day | ORAL | 1 refills | Status: DC
Start: 2019-09-30 — End: 2020-09-21

## 2019-09-30 NOTE — Progress Notes (Signed)
Subjective:    Patient ID: Douglas Robinson, male    DOB: 1960/01/18, 59 y.o.   MRN: [id number]  HPI  Here to f/u; overall doing ok,  Pt denies chest pain, increasing sob or doe, wheezing, orthopnea, PND, increased LE swelling, palpitations, dizziness or syncope.  Pt denies new neurological symptoms such as new headache, or facial or extremity weakness or numbness.  Pt denies polydipsia, polyuria, or low sugar episode.  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  Has been working on [employer] Past Medical History:  Diagnosis Date   Anxiety    COPD (chronic obstructive pulmonary disease) ([place])    Diabetes ([place])    GERD (gastroesophageal reflux disease)    HTN (hypertension)    Hyperlipidemia    OSA on CPAP    Sleep apnea    Past Surgical History:  Procedure Laterality Date   CARPAL TUNNEL RELEASE Right    COLONOSCOPY  2013   HERNIA REPAIR     POLYPECTOMY  2013   4 polyps-TA   [place]   "Sometime before 1996"    reports that he has never smoked. He has never used smokeless tobacco. He reports that he does not drink alcohol and does not use drugs. family history includes Cancer - Prostate in his father; Colon polyps in his father. Allergies  Allergen Reactions   Naproxen Rash   Current Outpatient Medications on File Prior to Visit  Medication Sig Dispense Refill   ACCU-CHEK AVIVA PLUS test strip TEST BLOOD SUGAR TWICE DAILY 200 strip 1   aspirin 81 MG tablet Take 81 mg by mouth daily.     HYDROcodone-acetaminophen (NORCO) 10-325 MG per tablet Take 1 tablet by mouth every 6 (six) hours as needed.     Multiple Vitamin (MULTIVITAMIN) tablet Take 1 tablet by mouth daily.     PRODIGY TWIST TOP LANCETS 28G MISC Use one lancet at each glucose check, as needed.     No current  facility-administered medications on file prior to visit.   Review of Systems All otherwise neg per pt     Objective:   Physical Exam BP 140/90 (BP Location: Left Arm, Patient Position: Sitting, Cuff Size: Large)    Pulse 96    Temp 98.6 F (37 C) (Oral)    Ht 5\' 10"  (1.778 m)    Wt 272 lb (123.4 kg)    SpO2 94%    BMI 39.03 kg/m  VS noted,  Constitutional: Pt appears in NAD HENT: Head: NCAT.  Right Ear: External ear normal.  Left Ear: External ear normal.  Eyes: . Pupils are equal, round, and reactive to light. Conjunctivae and EOM are normal Nose: without d/c or deformity Neck: Neck supple. Gross normal ROM Cardiovascular: Normal rate and regular rhythm.   Pulmonary/Chest: Effort normal and breath sounds without rales or wheezing.  Abd:  Soft, NT, ND, + BS, no organomegaly Neurological: Pt is alert. At baseline orientation, motor grossly intact Skin: Skin is warm. No rashes, other new lesions, no LE edema Psychiatric: Pt behavior is normal without agitation  All otherwise neg per pt Lab Results  Component Value Date   WBC 6.4 07/25/2018   HGB 14.3 07/25/2018   HCT 43.6 07/25/2018   PLT 177.0 07/25/2018   GLUCOSE 213 (H) 07/25/2018   CHOL 153 06/23/2019  TRIG 77.0 06/23/2019   HDL 48.80 06/23/2019   LDLCALC 89 06/23/2019   ALT 57 (H) 07/25/2018   AST 25 07/25/2018   NA 138 07/25/2018   K 4.0 07/25/2018   CL 99 07/25/2018   CREATININE 0.71 07/25/2018   BUN 10 07/25/2018   CO2 28 07/25/2018   TSH 0.977 07/22/2008   PSA 0.98 05/17/2009   INR 1.03 09/05/2009   HGBA1C 6.9 (H) 06/23/2019   MICROALBUR 0.60 02/22/2009   pocT  - A1c today - 6.3    Assessment & Plan:

## 2019-09-30 NOTE — Assessment & Plan Note (Addendum)
stable overall by history and exam, recent data reviewed with pt, and pt to continue medical treatment as before,  to f/u any worsening symptoms or concerns  I spent 31 minutes in preparing to see the patient by review of recent labs, imaging and procedures, obtaining and reviewing separately obtained history, communicating with the patient and family or caregiver, ordering medications, tests or procedures, and documenting clinical information in the EHR including the differential Dx, treatment, and any further evaluation and other management of dm, htn, hld  

## 2019-09-30 NOTE — Assessment & Plan Note (Signed)
stable overall by history and exam, recent data reviewed with pt, and pt to continue medical treatment as before,  to f/u any worsening symptoms or concerns  

## 2019-09-30 NOTE — Patient Instructions (Signed)
Please continue all other medications as before, and refills have been done if requested.  Please have the pharmacy call with any other refills you may need.  Please continue your efforts at being more active, low cholesterol diet, and weight control.  Please keep your appointments with your specialists as you may have planned  Please make an Appointment to return in 3 months

## 2019-10-02 ENCOUNTER — Telehealth: Payer: Self-pay | Admitting: Internal Medicine

## 2019-10-02 NOTE — Progress Notes (Signed)
°  Chronic Care Management   Outreach Note  10/02/2019 Name: Kourtland Coopman MRN: [id number] DOB: 01/29/1960  Referred by: Hoyt Koch, MD Reason for referral : No chief complaint on file.   Third unsuccessful telephone outreach was attempted today. The patient was referred to the pharmacist for assistance with care management and care coordination.   Follow Up Plan:   Carley Perdue [employer] Scheduler

## 2019-10-05 LAB — POCT GLYCOSYLATED HEMOGLOBIN (HGB A1C): Hemoglobin A1C: 6.3 % — AB (ref 4.0–5.6)

## 2019-10-05 NOTE — Addendum Note (Signed)
Addended by: Marijean Heath R on: 10/05/2019 01:54 PM   Modules accepted: Orders

## 2019-10-31 ENCOUNTER — Other Ambulatory Visit: Payer: Self-pay | Admitting: Internal Medicine

## 2019-11-03 DIAGNOSIS — H2513 Age-related nuclear cataract, bilateral: Secondary | ICD-10-CM | POA: Diagnosis not present

## 2019-11-03 DIAGNOSIS — E1136 Type 2 diabetes mellitus with diabetic cataract: Secondary | ICD-10-CM | POA: Diagnosis not present

## 2019-11-03 DIAGNOSIS — H524 Presbyopia: Secondary | ICD-10-CM | POA: Diagnosis not present

## 2019-11-09 ENCOUNTER — Telehealth: Payer: Self-pay | Admitting: Internal Medicine

## 2019-11-09 MED ORDER — ACCU-CHEK SOFTCLIX LANCETS MISC: 11 refills | Status: AC

## 2019-11-09 NOTE — Telephone Encounter (Signed)
    [employer] requesting order for Accu-Chek  SoftClix lancets

## 2019-11-20 DIAGNOSIS — G629 Polyneuropathy, unspecified: Secondary | ICD-10-CM | POA: Diagnosis not present

## 2019-11-20 DIAGNOSIS — G894 Chronic pain syndrome: Secondary | ICD-10-CM | POA: Diagnosis not present

## 2019-11-20 DIAGNOSIS — Z79899 Other long term (current) drug therapy: Secondary | ICD-10-CM | POA: Diagnosis not present

## 2019-11-20 DIAGNOSIS — F1120 Opioid dependence, uncomplicated: Secondary | ICD-10-CM | POA: Diagnosis not present

## 2020-01-20 DIAGNOSIS — Z79899 Other long term (current) drug therapy: Secondary | ICD-10-CM | POA: Diagnosis not present

## 2020-01-20 DIAGNOSIS — G8929 Other chronic pain: Secondary | ICD-10-CM | POA: Diagnosis not present

## 2020-01-20 DIAGNOSIS — G894 Chronic pain syndrome: Secondary | ICD-10-CM | POA: Diagnosis not present

## 2020-01-20 DIAGNOSIS — M25571 Pain in right ankle and joints of right foot: Secondary | ICD-10-CM | POA: Diagnosis not present

## 2020-01-26 ENCOUNTER — Other Ambulatory Visit: Payer: Self-pay

## 2020-01-26 ENCOUNTER — Ambulatory Visit (INDEPENDENT_AMBULATORY_CARE_PROVIDER_SITE_OTHER): Payer: Medicare HMO | Admitting: Internal Medicine

## 2020-01-26 ENCOUNTER — Encounter: Payer: Self-pay | Admitting: Internal Medicine

## 2020-01-26 VITALS — BP 132/70 | HR 97 | Temp 98.3°F | Ht 70.0 in | Wt 277.6 lb

## 2020-01-26 DIAGNOSIS — E1169 Type 2 diabetes mellitus with other specified complication: Secondary | ICD-10-CM | POA: Diagnosis not present

## 2020-01-26 DIAGNOSIS — I10 Essential (primary) hypertension: Secondary | ICD-10-CM

## 2020-01-26 DIAGNOSIS — Z0000 Encounter for general adult medical examination without abnormal findings: Secondary | ICD-10-CM | POA: Diagnosis not present

## 2020-01-26 DIAGNOSIS — K219 Gastro-esophageal reflux disease without esophagitis: Secondary | ICD-10-CM

## 2020-01-26 DIAGNOSIS — N2581 Secondary hyperparathyroidism of renal origin: Secondary | ICD-10-CM | POA: Diagnosis not present

## 2020-01-26 DIAGNOSIS — J4520 Mild intermittent asthma, uncomplicated: Secondary | ICD-10-CM

## 2020-01-26 DIAGNOSIS — E785 Hyperlipidemia, unspecified: Secondary | ICD-10-CM | POA: Diagnosis not present

## 2020-01-26 LAB — MICROALBUMIN / CREATININE URINE RATIO
Creatinine,U: 76.8 mg/dL
Microalb Creat Ratio: 0.9 mg/g (ref 0.0–30.0)
Microalb, Ur: <0.7 mg/dL (ref 0.0–1.9)

## 2020-01-26 LAB — COMPREHENSIVE METABOLIC PANEL
ALT: 62 U/L — ABNORMAL HIGH (ref 0–53)
AST: 26 U/L (ref 0–37)
Albumin: 4.6 g/dL (ref 3.5–5.2)
Alkaline Phosphatase: 64 U/L (ref 39–117)
BUN: 17 mg/dL (ref 6–23)
CO2: 29 mEq/L (ref 19–32)
Calcium: 9.9 mg/dL (ref 8.4–10.5)
Chloride: 100 mEq/L (ref 96–112)
Creatinine, Ser: 0.73 mg/dL (ref 0.40–1.50)
GFR: 98.39 mL/min (ref >60.00)
Glucose, Bld: 132 mg/dL — ABNORMAL HIGH (ref 70–99)
Potassium: 4.2 mEq/L (ref 3.5–5.1)
Sodium: 137 mEq/L (ref 135–145)
Total Bilirubin: 1.3 mg/dL — ABNORMAL HIGH (ref 0.2–1.2)
Total Protein: 7.9 g/dL (ref 6.0–8.3)

## 2020-01-26 LAB — CBC
HCT: 46.3 % (ref 39.0–52.0)
Hemoglobin: 15.3 g/dL (ref 13.0–17.0)
MCHC: 33.1 g/dL (ref 30.0–36.0)
MCV: 88.3 fl (ref 78.0–100.0)
Platelets: 168 10*3/uL (ref 150.0–400.0)
RBC: 5.25 Mil/uL (ref 4.22–5.81)
RDW: 15.6 % — ABNORMAL HIGH (ref 11.5–15.5)
WBC: 10.7 10*3/uL — ABNORMAL HIGH (ref 4.0–10.5)

## 2020-01-26 LAB — LIPID PANEL
Cholesterol: 139 mg/dL (ref 0–200)
HDL: 56.2 mg/dL (ref >39.00)
LDL Cholesterol: 69 mg/dL (ref 0–99)
NonHDL: 82.56
Total CHOL/HDL Ratio: 2
Triglycerides: 70 mg/dL (ref 0.0–149.0)
VLDL: 14 mg/dL (ref 0.0–40.0)

## 2020-01-26 LAB — HEMOGLOBIN A1C: Hgb A1c MFr Bld: 7.1 % — ABNORMAL HIGH (ref 4.6–6.5)

## 2020-01-26 NOTE — Progress Notes (Signed)
   Subjective:   Patient ID: Douglas Robinson, male    DOB: 1960/09/28, 60 y.o.   MRN: [id number]  HPI The patient is a 60 YO man coming in for physical.  PMH, [place], social history reviewed and updated  Review of Systems  Constitutional: Negative.   HENT: Negative.   Eyes: Negative.   Respiratory: Negative for cough, chest tightness and shortness of breath.   Cardiovascular: Negative for chest pain, palpitations and leg swelling.  Gastrointestinal: Negative for abdominal distention, abdominal pain, constipation, diarrhea, nausea and vomiting.  Musculoskeletal: Positive for arthralgias and myalgias.  Skin: Negative.   Neurological: Negative.   Psychiatric/Behavioral: Negative.     Objective:  Physical Exam Constitutional:      Appearance: He is well-developed and well-nourished.  HENT:     Head: Normocephalic and atraumatic.  Eyes:     Extraocular Movements: EOM normal.  Cardiovascular:     Rate and Rhythm: Normal rate and regular rhythm.  Pulmonary:     Effort: Pulmonary effort is normal. No respiratory distress.     Breath sounds: Normal breath sounds. No wheezing or rales.  Abdominal:     General: Bowel sounds are normal. There is no distension.     Palpations: Abdomen is soft.     Tenderness: There is no abdominal tenderness. There is no rebound.  Musculoskeletal:        General: Tenderness present. No edema.     Cervical back: Normal range of motion.  Skin:    General: Skin is warm and dry.     Comments: Foot exam done  Neurological:     Mental Status: He is alert and oriented to person, place, and time.     Coordination: Coordination normal.  Psychiatric:        Mood and Affect: Mood and affect normal.     Vitals:   01/26/20 0812  BP: 132/70  Pulse: 97  Temp: 98.3 F (36.8 C)  TempSrc: Oral  SpO2: 94%  Weight: 277 lb 9.6 oz (125.9 kg)  Height: 5\' 10"  (1.778 m)   This visit occurred during the SARS-CoV-2 public health emergency.  Safety protocols were  in place, including screening questions prior to the visit, additional usage of staff PPE, and extensive cleaning of exam room while observing appropriate contact time as indicated for disinfecting solutions.   Assessment & Plan:

## 2020-01-26 NOTE — Patient Instructions (Signed)

## 2020-01-29 NOTE — Assessment & Plan Note (Signed)
Flu shot up to date. Covid-19 up to date including booster. Pneumonia due at 77. Shingrix due declines. Tetanus due 2029. Colonoscopy due declines. Counseled about sun safety and mole surveillance. Counseled about the dangers of distracted driving. Given 10 year screening recommendations.

## 2020-01-29 NOTE — Assessment & Plan Note (Signed)
Checking lipid panel and adjust pravastatin 10 mg daily.

## 2020-01-29 NOTE — Assessment & Plan Note (Signed)
BP at goal on hctz and irbesartan. Checking CMP.

## 2020-01-29 NOTE — Assessment & Plan Note (Signed)
Checking HgA1c, adjust jardiance and januvia and metformin and glipizide. Foot exam done and reminded about eye exam.

## 2020-01-29 NOTE — Assessment & Plan Note (Signed)
Controlled on protonix daily.

## 2020-01-29 NOTE — Assessment & Plan Note (Signed)
Stable on albuterol prn 

## 2020-02-15 ENCOUNTER — Other Ambulatory Visit: Payer: Self-pay | Admitting: Internal Medicine

## 2020-02-15 NOTE — Telephone Encounter (Signed)
Ok forward to pcp 

## 2020-02-19 DIAGNOSIS — G894 Chronic pain syndrome: Secondary | ICD-10-CM | POA: Diagnosis not present

## 2020-02-19 DIAGNOSIS — Z79899 Other long term (current) drug therapy: Secondary | ICD-10-CM | POA: Diagnosis not present

## 2020-02-19 DIAGNOSIS — G629 Polyneuropathy, unspecified: Secondary | ICD-10-CM | POA: Diagnosis not present

## 2020-02-19 DIAGNOSIS — F1120 Opioid dependence, uncomplicated: Secondary | ICD-10-CM | POA: Diagnosis not present

## 2020-02-22 ENCOUNTER — Other Ambulatory Visit: Payer: Self-pay | Admitting: Internal Medicine

## 2020-02-22 NOTE — Telephone Encounter (Signed)
Please refill as per office routine med refill policy (all routine meds refilled for 3 mo or monthly per pt preference up to one year from last visit, then month to month grace period for 3 mo, then further med refills will have to be denied)  

## 2020-03-15 DIAGNOSIS — H00022 Hordeolum internum right lower eyelid: Secondary | ICD-10-CM | POA: Diagnosis not present

## 2020-04-07 ENCOUNTER — Other Ambulatory Visit: Payer: Self-pay | Admitting: Internal Medicine

## 2020-04-07 NOTE — Telephone Encounter (Signed)
To pcp

## 2020-04-14 ENCOUNTER — Encounter: Payer: Self-pay | Admitting: Gastroenterology

## 2020-04-18 DIAGNOSIS — M25571 Pain in right ankle and joints of right foot: Secondary | ICD-10-CM | POA: Diagnosis not present

## 2020-04-18 DIAGNOSIS — Z79899 Other long term (current) drug therapy: Secondary | ICD-10-CM | POA: Diagnosis not present

## 2020-04-18 DIAGNOSIS — G629 Polyneuropathy, unspecified: Secondary | ICD-10-CM | POA: Diagnosis not present

## 2020-04-18 DIAGNOSIS — E119 Type 2 diabetes mellitus without complications: Secondary | ICD-10-CM | POA: Diagnosis not present

## 2020-04-18 DIAGNOSIS — E559 Vitamin D deficiency, unspecified: Secondary | ICD-10-CM | POA: Diagnosis not present

## 2020-04-18 DIAGNOSIS — G8929 Other chronic pain: Secondary | ICD-10-CM | POA: Diagnosis not present

## 2020-04-18 LAB — CBC AND DIFFERENTIAL
HCT: 50 (ref 41–53)
Hemoglobin: 15.9 (ref 13.5–17.5)
Platelets: 231 (ref 150–399)
WBC: 8.6

## 2020-04-18 LAB — HEPATIC FUNCTION PANEL
ALT: 66 — AB (ref 10–40)
AST: 32 (ref 14–40)
Alkaline Phosphatase: 61 (ref 25–125)
Bilirubin, Total: 1.1

## 2020-04-18 LAB — VITAMIN D 25 HYDROXY (VIT D DEFICIENCY, FRACTURES): Vit D, 25-Hydroxy: 49.34

## 2020-04-18 LAB — BASIC METABOLIC PANEL
BUN: 18 (ref 4–21)
CO2: 30 — AB (ref 13–22)
Chloride: 96 — AB (ref 99–108)
Creatinine: 0.7 (ref 0.6–1.3)
Glucose: 118
Potassium: 4.4 (ref 3.4–5.3)
Sodium: 139 (ref 137–147)

## 2020-04-18 LAB — COMPREHENSIVE METABOLIC PANEL
Albumin: 4.6 (ref 3.5–5.0)
Calcium: 9.7 (ref 8.7–10.7)
GFR calc Af Amer: 143
GFR calc non Af Amer: 118
Globulin: 3.2

## 2020-04-18 LAB — CBC: RBC: 5.45 — AB (ref 3.87–5.11)

## 2020-04-25 ENCOUNTER — Other Ambulatory Visit: Payer: Self-pay | Admitting: Internal Medicine

## 2020-04-25 NOTE — Telephone Encounter (Signed)
Please refill as per office routine med refill policy (all routine meds refilled for 3 mo or monthly per pt preference up to one year from last visit, then month to month grace period for 3 mo, then further med refills will have to be denied)  

## 2020-04-28 ENCOUNTER — Other Ambulatory Visit: Payer: Self-pay | Admitting: Internal Medicine

## 2020-05-05 ENCOUNTER — Telehealth: Payer: Self-pay | Admitting: Internal Medicine

## 2020-05-05 MED ORDER — PANTOPRAZOLE SODIUM 20 MG PO TBEC: 20.0000 mg | DELAYED_RELEASE_TABLET | Freq: Every day | ORAL | 1 refills | Status: DC

## 2020-05-05 NOTE — Telephone Encounter (Signed)
pantoprazole (PROTONIX) 20 MG tablet [id number] [address], [place] AT [address] [place] Phone:  [phone]  Fax:  [phone]     Last seen- 01.11.22 Next apt-04.22.22

## 2020-05-05 NOTE — Telephone Encounter (Signed)
Refill has been sent to the patient's pharmacy.  

## 2020-05-06 ENCOUNTER — Other Ambulatory Visit: Payer: Self-pay

## 2020-05-06 ENCOUNTER — Encounter: Payer: Self-pay | Admitting: Internal Medicine

## 2020-05-06 ENCOUNTER — Ambulatory Visit (INDEPENDENT_AMBULATORY_CARE_PROVIDER_SITE_OTHER): Payer: Medicare HMO | Admitting: Internal Medicine

## 2020-05-06 VITALS — BP 118/86 | HR 96 | Temp 98.2°F | Ht 70.0 in | Wt 274.0 lb

## 2020-05-06 DIAGNOSIS — E1169 Type 2 diabetes mellitus with other specified complication: Secondary | ICD-10-CM | POA: Diagnosis not present

## 2020-05-06 DIAGNOSIS — E785 Hyperlipidemia, unspecified: Secondary | ICD-10-CM | POA: Diagnosis not present

## 2020-05-06 DIAGNOSIS — C50412 Malignant neoplasm of upper-outer quadrant of left female breast: Secondary | ICD-10-CM | POA: Diagnosis not present

## 2020-05-06 LAB — POCT GLYCOSYLATED HEMOGLOBIN (HGB A1C): Hemoglobin A1C: 7.1 % — AB (ref 4.0–5.6)

## 2020-05-06 NOTE — Patient Instructions (Signed)
Your HgA1c was 7.1 today so keep up the good work.

## 2020-05-06 NOTE — Progress Notes (Signed)
   Subjective:   Patient ID: Douglas Robinson, male    DOB: [birth year], 60 y.o.   MRN: [id number]  HPI The patient is a 60 YO man coming in for follow up diabetes. Last HgA1c at goal. Taking metformin and jardiance and acarbose and glipizide and januvia. Denies side effects with medications. Checks sugars some and have been stable overall. Denies low sugars.   Review of Systems  Constitutional: Negative.   HENT: Negative.   Eyes: Negative.   Respiratory: Negative for cough, chest tightness and shortness of breath.   Cardiovascular: Negative for chest pain, palpitations and leg swelling.  Gastrointestinal: Negative for abdominal distention, abdominal pain, constipation, diarrhea, nausea and vomiting.  Musculoskeletal: Positive for arthralgias.  Skin: Negative.   Neurological: Positive for numbness.  Psychiatric/Behavioral: Negative.     Objective:  Physical Exam Constitutional:      Appearance: He is well-developed. He is obese.  HENT:     Head: Normocephalic and atraumatic.  Cardiovascular:     Rate and Rhythm: Normal rate and regular rhythm.  Pulmonary:     Effort: Pulmonary effort is normal. No respiratory distress.     Breath sounds: Normal breath sounds. No wheezing or rales.  Abdominal:     General: Bowel sounds are normal. There is no distension.     Palpations: Abdomen is soft.     Tenderness: There is no abdominal tenderness. There is no rebound.  Musculoskeletal:     Cervical back: Normal range of motion.  Skin:    General: Skin is warm and dry.  Neurological:     Mental Status: He is alert and oriented to person, place, and time.     Coordination: Coordination normal.     Vitals:   05/06/20 0819  BP: 118/86  Pulse: 96  Temp: 98.2 F (36.8 C)  TempSrc: Oral  SpO2: 98%  Weight: 274 lb (124.3 kg)  Height: 5\' 10"  (1.778 m)    This visit occurred during the SARS-CoV-2 public health emergency.  Safety protocols were in place, including screening questions  prior to the visit, additional usage of staff PPE, and extensive cleaning of exam room while observing appropriate contact time as indicated for disinfecting solutions.   Assessment & Plan:  Visit time 20 minutes in face to face communication with patient and coordination of care, additional 5 minutes spent in record review, coordination or care, ordering tests, communicating/referring to other healthcare professionals, documenting in medical records all on the same day of the visit for total time 25 minutes spent on the visit.

## 2020-05-06 NOTE — Assessment & Plan Note (Signed)
Taking acarbose, metformin, jardiance, januvia, glipizide. HgA1c done in office 7.1 today. At goal and will keep regimen. Taking ARB and statin.

## 2020-05-10 ENCOUNTER — Other Ambulatory Visit: Payer: Self-pay | Admitting: *Deleted

## 2020-05-10 MED ORDER — PANTOPRAZOLE SODIUM 20 MG PO TBEC: 20.0000 mg | DELAYED_RELEASE_TABLET | Freq: Every day | ORAL | 1 refills | Status: DC

## 2020-06-09 ENCOUNTER — Telehealth: Payer: Self-pay | Admitting: Internal Medicine

## 2020-06-09 NOTE — Progress Notes (Signed)
  Chronic Care Management   Outreach Note  06/09/2020 Name: Douglas Robinson MRN: [id number] DOB: 14-Aug-1960  Referred by: Hoyt Koch, MD Reason for referral : No chief complaint on file.   An unsuccessful telephone outreach was attempted today. The patient was referred to the pharmacist for assistance with care management and care coordination.   Follow Up Plan:   Lauretta Grill [employer] Scheduler

## 2020-06-20 ENCOUNTER — Telehealth: Payer: Self-pay | Admitting: Internal Medicine

## 2020-06-20 DIAGNOSIS — G8929 Other chronic pain: Secondary | ICD-10-CM | POA: Diagnosis not present

## 2020-06-20 DIAGNOSIS — E119 Type 2 diabetes mellitus without complications: Secondary | ICD-10-CM | POA: Diagnosis not present

## 2020-06-20 DIAGNOSIS — M25571 Pain in right ankle and joints of right foot: Secondary | ICD-10-CM | POA: Diagnosis not present

## 2020-06-20 DIAGNOSIS — G629 Polyneuropathy, unspecified: Secondary | ICD-10-CM | POA: Diagnosis not present

## 2020-06-20 DIAGNOSIS — R030 Elevated blood-pressure reading, without diagnosis of hypertension: Secondary | ICD-10-CM | POA: Diagnosis not present

## 2020-06-20 DIAGNOSIS — Z6839 Body mass index (BMI) 39.0-39.9, adult: Secondary | ICD-10-CM | POA: Diagnosis not present

## 2020-06-20 DIAGNOSIS — Z79899 Other long term (current) drug therapy: Secondary | ICD-10-CM | POA: Diagnosis not present

## 2020-06-20 NOTE — Chronic Care Management (AMB) (Signed)
  Chronic Care Management   Note  06/20/2020 Name: Volney Reierson MRN: [id number] DOB: 1960/05/13  Allenmichael Mcpartlin is a 60 y.o. year old male who is a primary care patient of Hoyt Koch, MD. I reached out to Tamala Fothergill by phone today in response to a referral sent by Mr. [employer] PCP, Hoyt Koch, MD.   Mr. Debord was given information about Chronic Care Management services today including:  1. CCM service includes personalized support from designated clinical staff supervised by his physician, including individualized plan of care and coordination with other care providers 2. 24/7 contact phone numbers for assistance for urgent and routine care needs. 3. Service will only be billed when office clinical staff spend 20 minutes or more in a month to coordinate care. 4. Only one practitioner may furnish and bill the service in a calendar month. 5. The patient may stop CCM services at any time (effective at the end of the month) by phone call to the office staff.   Patient agreed to services and verbal consent obtained.   Follow up plan:   Lauretta Grill [employer] Scheduler

## 2020-07-19 ENCOUNTER — Ambulatory Visit (AMBULATORY_SURGERY_CENTER): Payer: Medicare HMO

## 2020-07-19 ENCOUNTER — Other Ambulatory Visit: Payer: Self-pay

## 2020-07-19 VITALS — Ht 70.0 in | Wt 275.0 lb

## 2020-07-19 DIAGNOSIS — Z86010 Personal history of colonic polyps: Secondary | ICD-10-CM

## 2020-07-19 MED ORDER — NA SULFATE-K SULFATE-MG SULF 17.5-3.13-1.6 GM/177ML PO SOLN: 1.0000 | Freq: Once | ORAL | 0 refills | Status: AC

## 2020-07-19 NOTE — Progress Notes (Signed)

## 2020-07-28 ENCOUNTER — Other Ambulatory Visit: Payer: Self-pay | Admitting: Internal Medicine

## 2020-08-02 ENCOUNTER — Encounter: Payer: Self-pay | Admitting: Gastroenterology

## 2020-08-02 ENCOUNTER — Other Ambulatory Visit: Payer: Self-pay

## 2020-08-02 ENCOUNTER — Ambulatory Visit (AMBULATORY_SURGERY_CENTER): Payer: Medicare HMO | Admitting: Gastroenterology

## 2020-08-02 VITALS — BP 131/82 | HR 91 | Temp 97.4°F | Resp 18 | Ht 70.0 in | Wt 275.0 lb

## 2020-08-02 DIAGNOSIS — Z86010 Personal history of colonic polyps: Secondary | ICD-10-CM

## 2020-08-02 DIAGNOSIS — D123 Benign neoplasm of transverse colon: Secondary | ICD-10-CM | POA: Diagnosis not present

## 2020-08-02 MED ORDER — SODIUM CHLORIDE 0.9 % IV SOLN
500.0000 mL | Freq: Once | INTRAVENOUS | Status: DC
Start: 2020-08-02 — End: 2020-08-02

## 2020-08-02 NOTE — Progress Notes (Deleted)
Chronic Care Management Pharmacy Note  08/03/2020 Name:  Douglas Robinson MRN:  [id number] DOB:  May 19, 1960  Summary: ***  Recommendations/Changes made from today's visit: ***  Plan: ***  Subjective: Douglas Robinson is an 60 y.o. year old male who is a primary patient of Hoyt Koch, MD.  The CCM team was consulted for assistance with disease management and care coordination needs.    {CCMTELEPHONEFACETOFACE:21091510} for {CCMINITIALFOLLOWUPCHOICE:21091511} in response to provider referral for pharmacy case management and/or care coordination services.   Consent to Services:  {CCMCONSENTOPTIONS:25074}  Patient Care Team: Hoyt Koch, MD as PCP - General (Internal Medicine) Milus Banister, MD as Attending Physician (Gastroenterology) Rutherford Guys, MD as Consulting Physician (Ophthalmology) Jeanella Anton, NP as Nurse Practitioner (Nurse Practitioner) Tomasa Blase, [facility] as Pharmacist (Pharmacist)  Recent office visits: 05/06/2020 - PCP visit - A1c at goal, patient stable, no changes to medications  01/26/2020 - PCP visit - no changes at this time, patient stable    Recent consult visits: 08/02/2020 - colonoscopy completed  06/20/2020 - Marcelina Morel RN - pain management - pain in right ankle 04/18/2020 - Marcelina Morel RN - pain management  03/15/2020 - Dr. Gershon Crane - [facility] - bump on lower right eye lid, recommended to start ofloxacin eye drops  02/19/2020 - Marcelina Morel RN - pain management   Hospital visits: None in previous 6 months  Objective:  Lab Results  Component Value Date   CREATININE 0.7 04/18/2020   BUN 18 04/18/2020   GFR 98.39 01/26/2020   GFRNONAA 118 04/18/2020   GFRAA 143 04/18/2020   NA 139 04/18/2020   K 4.4 04/18/2020   CALCIUM 9.7 04/18/2020   CO2 30 (A) 04/18/2020   GLUCOSE 132 (H) 01/26/2020    Lab Results  Component Value Date/Time   HGBA1C 7.1 (A) 05/06/2020 08:43 AM   HGBA1C 7.1 (H) 01/26/2020  08:37 AM   HGBA1C 6.3 (A) 10/05/2019 01:53 PM   HGBA1C 6.9 (H) 06/23/2019 08:50 AM   GFR 98.39 01/26/2020 08:37 AM   GFR 137.06 07/25/2018 09:07 AM   MICROALBUR <0.7 01/26/2020 08:37 AM   MICROALBUR 0.60 02/22/2009 08:32 PM    Last diabetic Eye exam:  Lab Results  Component Value Date/Time   HMDIABEYEEXA No Retinopathy 10/27/2018 12:00 AM    Last diabetic Foot exam:  No results found for: HMDIABFOOTEX   Lab Results  Component Value Date   CHOL 139 01/26/2020   HDL 56.20 01/26/2020   LDLCALC 69 01/26/2020   TRIG 70.0 01/26/2020   CHOLHDL 2 01/26/2020    Hepatic Function Latest Ref Rng & Units 04/18/2020 01/26/2020 07/25/2018  Total Protein 6.0 - 8.3 g/dL - 7.9 7.7  Albumin 3.5 - 5.0 4.6 4.6 4.5  AST 14 - 40 32 26 25  ALT 10 - 40 66(A) 62(H) 57(H)  Alk Phosphatase 25 - 125 61 64 69  Total Bilirubin 0.2 - 1.2 mg/dL - 1.3(H) 1.0    Lab Results  Component Value Date/Time   TSH 0.977 07/22/2008 08:33 PM    CBC Latest Ref Rng & Units 04/18/2020 01/26/2020 07/25/2018  WBC - 8.6 10.7(H) 6.4  Hemoglobin 13.5 - 17.5 15.9 15.3 14.3  Hematocrit 41 - 53 50 46.3 43.6  Platelets 150 - 399 231 168.0 177.0    Lab Results  Component Value Date/Time   VD25OH 49.34 04/18/2020 11:33 AM    Clinical ASCVD: No  The 10-year ASCVD risk score Mikey Bussing DC Jr., et al., 2013) is: 22.8%  Values used to calculate the score:     Age: 68 years     Sex: Male     Is Non-Hispanic African American: Yes     Diabetic: Yes     Tobacco smoker: No     Systolic Blood Pressure: 003 mmHg     Is BP treated: Yes     HDL Cholesterol: 56.2 mg/dL     Total Cholesterol: 139 mg/dL    Depression screen [facility] 2/9 05/06/2020 08/12/2019 08/06/2018  Decreased Interest 0 0 0  Down, Depressed, Hopeless 0 0 1  PHQ - 2 Score 0 0 1  Altered sleeping - - 1  Tired, decreased energy - - 1  Change in appetite - - 0  Feeling bad or failure about yourself  - - 0  Trouble concentrating - - 0  Moving slowly or fidgety/restless -  - 0  Suicidal thoughts - - 0  PHQ-9 Score - - 3  Difficult doing work/chores - - Not difficult at all    Social History   Tobacco Use  Smoking Status Never   Passive exposure: Never  Smokeless Tobacco Never   BP Readings from Last 3 Encounters:  08/02/20 131/82  05/06/20 118/86  01/26/20 132/70   Pulse Readings from Last 3 Encounters:  08/02/20 91  05/06/20 96  01/26/20 97   Wt Readings from Last 3 Encounters:  08/02/20 275 lb (124.7 kg)  07/19/20 275 lb (124.7 kg)  05/06/20 274 lb (124.3 kg)   BMI Readings from Last 3 Encounters:  08/02/20 39.46 kg/m  07/19/20 39.46 kg/m  05/06/20 39.31 kg/m    Assessment/Interventions: Review of patient past medical history, allergies, medications, health status, including review of consultants reports, laboratory and other test data, was performed as part of comprehensive evaluation and provision of chronic care management services.   SDOH:  (Social Determinants of Health) assessments and interventions performed: {yes/no:20286}  SDOH Screenings   Alcohol Screen: Low Risk    Last Alcohol Screening Score (AUDIT): 0  Depression (PHQ2-9): Low Risk    PHQ-2 Score: 0  Financial Resource Strain: Medium Risk   Difficulty of Paying Living Expenses: Somewhat hard  Food Insecurity: Food Insecurity Present   Worried About Charity fundraiser in the Last Year: Sometimes true   Arboriculturist in the Last Year: Sometimes true  Housing: Low Risk    Last Housing Risk Score: 0  Physical Activity: Not on file  Social Connections: Not on file  Stress: No Stress Concern Present   Feeling of Stress : Not at all  Tobacco Use: Low Risk    Smoking Tobacco Use: Never   Smokeless Tobacco Use: Never  Transportation Needs: No Transportation Needs   Lack of Transportation (Medical): No   Lack of Transportation (Non-Medical): No    CCM Care Plan  Allergies  Allergen Reactions   Naproxen Rash    Medications Reviewed Today     Reviewed by  Milus Banister, MD (Physician) on 08/02/20 at 0727  Med List Status: <None>   Medication Order Taking? Sig Documenting Provider Last Dose Status Informant  0.9 %  sodium chloride infusion [id number]   Milus Banister, MD  Active   acarbose (PRECOSE) 50 MG tablet [id number] Yes TAKE 1 TABLET THREE TIMES DAILY WITH MEALS Biagio Borg, MD 08/01/2020 Active   ACCU-CHEK AVIVA PLUS test strip [id number] No TEST BLOOD SUGAR TWICE DAILY  Patient not taking: No sig reported   Hoyt Koch, MD Not  Taking Active   Accu-Chek Softclix Lancets lancets [id number] No Use to check glucose as needed E11.9  Patient not taking: No sig reported   Hoyt Koch, MD Not Taking Active   albuterol (VENTOLIN HFA) 108 (90 Base) MCG/ACT inhaler [id number] Yes INHALE 1 TO 2 PUFFS EVERY 6 HOURS AS NEEDED FOR WHEEZING  OR FOR SHORTNESS OF Marcella Dubs, MD Past Week Active   aspirin 81 MG tablet [id number] Yes Take 81 mg by mouth daily. [provider] 08/01/2020 Active   glipiZIDE (GLUCOTROL XL) 10 MG 24 hr tablet [id number] Yes TAKE 1 TABLET EVERY DAY  WITH  BREAKFAST Hoyt Koch, MD 08/01/2020 Active   hydrochlorothiazide (HYDRODIURIL) 25 MG tablet [id number] Yes Take 1 tablet (25 mg total) by mouth daily. Biagio Borg, MD 08/01/2020 Active   HYDROcodone-acetaminophen (NORCO) 10-325 MG per tablet [id number] Yes Take 1 tablet by mouth every 6 (six) hours as needed. [provider] 08/01/2020 Active   irbesartan (AVAPRO) 300 MG tablet [id number] Yes TAKE 1 TABLET (300 MG TOTAL) BY MOUTH DAILY. Biagio Borg, MD 08/01/2020 Active   JANUVIA 100 MG tablet [id number] Yes TAKE 1 TABLET (100 MG TOTAL) BY MOUTH DAILY. Hoyt Koch, MD 08/01/2020 Active   JARDIANCE 25 MG TABS tablet [id number] Yes TAKE 1 TABLET EVERY DAY Hoyt Koch, MD 08/01/2020 Active   metFORMIN (GLUCOPHAGE) 1000 MG tablet [id number] Yes TAKE 1 TABLET TWICE DAILY WITH MEALS Hoyt Koch, MD 08/01/2020  Active   mirtazapine (REMERON) 45 MG tablet [id number] Yes TAKE 1 TABLET (45 MG TOTAL) BY MOUTH AT BEDTIME. DISCONTINUE ANY OTHER DOSES OF REMERON. Hoyt Koch, MD 08/01/2020 Active   Multiple Vitamin (MULTIVITAMIN) tablet [id number] No Take 1 tablet by mouth daily.  Patient not taking: Reported on 08/02/2020   [provider] Not Taking Active   ofloxacin (OCUFLOX) 0.3 % ophthalmic solution [id number] No   Patient not taking: Reported on 08/02/2020   [provider] Not Taking Active   pantoprazole (PROTONIX) 20 MG tablet [id number] Yes Take 1 tablet (20 mg total) by mouth daily. Hoyt Koch, MD 08/01/2020 Active   pravastatin (PRAVACHOL) 10 MG tablet [id number] Yes TAKE 1 TABLET (10 MG TOTAL) BY MOUTH DAILY. Biagio Borg, MD 08/01/2020 Active   PRODIGY TWIST TOP LANCETS 28G MISC [id number] No Use one lancet at each glucose check, as needed.  Patient not taking: No sig reported   [provider] Not Taking Active            Med Note Duard Brady, Ree Shay   Tue Dec 14, 2014  9:27 AM) Received from: [facility]  traZODone (DESYREL) 100 MG tablet [id number] Yes Take 1 tablet by mouth at bedtime. [provider] 08/01/2020 Active             Patient Active Problem List   Diagnosis Date Noted   Routine general medical examination at a health care facility 07/25/2018   Insomnia 02/26/2018   Elevated LFTs 12/20/2017   Attention or concentration deficit 09/30/2017   Chronic pain syndrome 09/30/2017   Type 2 diabetes mellitus with hyperlipidemia ([place]) 08/29/2017   Essential hypertension 08/29/2017   Hyperlipidemia associated with type 2 diabetes mellitus ([place]) 08/29/2017   Foot pain 08/29/2017   GERD (gastroesophageal reflux disease) 08/29/2017   OSA on CPAP 02/25/2013   H/O seasonal allergies 08/30/2011   Arm numbness 10/19/2010   Arthritis 10/19/2010   Asthma 10/19/2010    Immunization History  Administered Date(s) Administered  Fluad  Quad(high Dose 65+) 10/10/2018   Influenza,inj,Quad PF,6+ Mos 09/25/2017, 09/23/2018   Influenza-Unspecified 09/23/2017, 11/16/2019   Moderna Sars-Covid-2 Vaccination 05/19/2019, 06/16/2019, 01/25/2020   Pneumococcal Polysaccharide-23 09/13/2011   Tdap 11/06/2017    Conditions to be addressed/monitored:  Hypertension, Hyperlipidemia, Diabetes, GERD, Asthma, and ***  There are no care plans that you recently modified to display for this patient.     Medication Assistance: {MEDASSISTANCEINFO:25044}  Compliance/Adherence/Medication fill history: Care Gaps: ***  Patient's preferred pharmacy is:  [facility], [place] - [address] AT [place] [address] Phone: [phone] Fax: [phone]  [facility] ([facility] - [place], [place] [address] Phone: [phone] Fax: [phone]   Uses pill box? {Yes or If no, why not?:20788} Pt endorses ***% compliance  Care Plan and Follow Up Patient Decision:  {FOLLOWUP:24991}  Plan: {CM FOLLOW UP WSFK:81275}  ***  Current Barriers:  {pharmacybarriers:24917}  Pharmacist Clinical Goal(s):  Patient will {PHARMACYGOALCHOICES:24921} through collaboration with PharmD and provider.   Interventions: 1:1 collaboration with Hoyt Koch, MD regarding development and update of comprehensive plan of care as evidenced by provider attestation and co-signature Inter-disciplinary care team collaboration (see longitudinal plan of care) Comprehensive medication review performed; medication list updated in electronic medical record  Hypertension (BP goal <130/80) -{US controlled/uncontrolled:25276} -Current treatment: Irbesartan 375m - 1 tablet daily  Hydrochlorothiazide 232m- 1 tablet daily  -Medications previously tried: n/a  -Current home readings: *** -Current  dietary habits: *** -Current exercise habits: *** -{ACTIONS;DENIES/REPORTS:21021675} hypotensive/hypertensive symptoms -Educated on {CCM BP Counseling:25124} -Counseled to monitor BP at home ***, document, and provide log at future appointments -{CCMPHARMDINTERVENTION:25122}  Hyperlipidemia: (LDL goal < 70) -{US controlled/uncontrolled:25276} -Last LDL 6927mL (01/26/2020) -Current treatment: Pravastatin 35m75m1 tablet daily  Aspirin 81mg70m tablet daily  -Medications previously tried: ***  -Current dietary patterns: *** -Current exercise habits: *** -Educated on {CCM HLD Counseling:25126} -{CCMPHARMDINTERVENTION:25122}  Diabetes (A1c goal <7%) -{US controlled/uncontrolled:25276} -Last A1c 7.1% (05/06/2020) -Current medications: Jardiance 25mg 68mtablet daily  Glipizide XL 35mg -54mablet daily  Metformin 1000mg - 60mblet twice daily  Januvia 100mg - 156mlet daily  Acarbose 50mg - 1 8met 3 times daily with meals  -Medications previously tried: ***  -Current home glucose readings fasting glucose: *** post prandial glucose: *** -{ACTIONS;DENIES/REPORTS:21021675} hypoglycemic/hyperglycemic symptoms -Current meal patterns:  breakfast: ***  lunch: ***  dinner: *** snacks: *** drinks: *** -Current exercise: *** -Educated on {CCM DM COUNSELING:25123} -Counseled to check feet daily and get yearly eye exams -{CCMPHARMDINTERVENTION:25122}  Asthma (Goal: control symptoms and prevent exacerbations) -Controlled -Current treatment  Albuterol HFA 108 mcg/act - 1-2 puffs every 6 hours as needed  -Medications previously tried: Advair   -Exacerbations requiring treatment in last 6 months: *** -Patient {Actions; denies-reports:120008} consistent use of maintenance inhaler -Frequency of rescue inhaler use: *** -Counseled on {CCMINHALERCOUNSELING:25121} -{CCMPHARMDINTERVENTION:25122}  GERD (Goal: Acid control / prevention of reflux symptoms) -{US  controlled/uncontrolled:25276} -Current treatment  Pantoprazole 20mg - 1 t9mt daily -Medications previously tried: famotidine  -{CCMPHARMDINTERVENTION:25122}  Insomnia (Goal: Promotion of better quality sleep) -{US controlled/uncontrolled:25276} -Current treatment  Trazodone 100mg - 1 ta84m at bedtime  Mirtazapine 45mg - 1 tab30mnightly at bedtime  -Medications previously tried: ramelton, amitriptyline, diphenhydramine, hydroxyzine  -{CCMPHARMDINTERVENTION:25122}  Chronic Pain (Goal: Pain control) - Managed by Julie GrossmaMarcelina Morelrolled/uncontrolled:25276} -  Current treatment  Hydrocodone/APAP 10-334m - 1 tablet every 6 hours as needed  -Medications previously tried: ***  -{CCMPHARMDINTERVENTION:25122}   Health Maintenance -Vaccine gaps: *** -Current therapy:  *** -Educated on {ccm supplement counseling:25128} -{CCM Patient satisfied:25129} -{CCMPHARMDINTERVENTION:25122}  Patient Goals/Self-Care Activities Patient will:  - {pharmacypatientgoals:24919}  Follow Up Plan: {CM FOLLOW UP PLAN:22241}   Current Chart Prep time = 21 minutes

## 2020-08-02 NOTE — Progress Notes (Signed)
Called to room to assist during endoscopic procedure.  Patient ID and intended procedure confirmed with present staff. Received instructions for my participation in the procedure from the performing physician.  

## 2020-08-02 NOTE — Op Note (Signed)
[facility] Patient Name: Douglas Robinson Procedure Date: 08/02/2020 7:23 AM MRN: [id number] Endoscopist: Milus Banister , MD Age: 60 Referring MD:  Date of Birth: 18-Apr-1960 Gender: Male Account #: [id number] Procedure:                Colonoscopy Indications:              High risk colon cancer surveillance: Personal                            history of colonic polyps: colonoscopy 12/2014 two                            subCM polyps, one was adenomatous Medicines:                Monitored Anesthesia Care Procedure:                Pre-Anesthesia Assessment:                           - Prior to the procedure, a History and Physical                            was performed, and patient medications and                            allergies were reviewed. The patient's tolerance of                            previous anesthesia was also reviewed. The risks                            and benefits of the procedure and the sedation                            options and risks were discussed with the patient.                            All questions were answered, and informed consent                            was obtained. Prior Anticoagulants: The patient has                            taken no previous anticoagulant or antiplatelet                            agents. ASA Grade Assessment: III - A patient with                            severe systemic disease. After reviewing the risks                            and benefits, the patient was deemed in  satisfactory condition to undergo the procedure.                           After obtaining informed consent, the colonoscope                            was passed under direct vision. Throughout the                            procedure, the patient's blood pressure, pulse, and                            oxygen saturations were monitored continuously. The                            Olympus CF-HQ190L  [id number]) Colonoscope was                            introduced through the anus and advanced to the the                            cecum, identified by appendiceal orifice and                            ileocecal valve. The colonoscopy was performed                            without difficulty. The patient tolerated the                            procedure well. The quality of the bowel                            preparation was good. The ileocecal valve,                            appendiceal orifice, and rectum were photographed. Scope In: 7:53:45 AM Scope Out: 4:54:09 AM Scope Withdrawal Time: 0 hours 9 minutes 37 seconds  Total Procedure Duration: 0 hours 13 minutes 3 seconds  Findings:                 A 1 mm polyp was found in the transverse colon. The                            polyp was sessile. The polyp was removed with a                            cold snare. Resection and retrieval were complete.                           The exam was otherwise without abnormality on                            direct and retroflexion views. Complications:  No immediate complications. Estimated blood loss:                            None. Estimated Blood Loss:     Estimated blood loss: none. Impression:               - One 1 mm polyp in the transverse colon, removed                            with a cold snare. Resected and retrieved.                           - The examination was otherwise normal on direct                            and retroflexion views. Recommendation:           - Patient has a contact number available for                            emergencies. The signs and symptoms of potential                            delayed complications were discussed with the                            patient. Return to normal activities tomorrow.                            Written discharge instructions were provided to the                            patient.                            - Resume previous diet.                           - Continue present medications.                           - Await pathology results. Milus Banister, MD 08/02/2020 8:09:16 AM This report has been signed electronically.

## 2020-08-02 NOTE — Patient Instructions (Signed)
YOU HAD AN ENDOSCOPIC PROCEDURE TODAY AT THE [facility]:   Refer to the procedure report that was given to you for any specific questions about what was found during the examination.  If the procedure report does not answer your questions, please call your gastroenterologist to clarify.  If you requested that your care partner not be given the details of your procedure findings, then the procedure report has been included in a sealed envelope for you to review at your convenience later.  YOU SHOULD EXPECT: Some feelings of bloating in the abdomen. Passage of more gas than usual.  Walking can help get rid of the air that was put into your GI tract during the procedure and reduce the bloating. If you had a lower endoscopy (such as a colonoscopy or flexible sigmoidoscopy) you may notice spotting of blood in your stool or on the toilet paper. If you underwent a bowel prep for your procedure, you may not have a normal bowel movement for a few days.  Please Note:  You might notice some irritation and congestion in your nose or some drainage.  This is from the oxygen used during your procedure.  There is no need for concern and it should clear up in a day or so.  SYMPTOMS TO REPORT IMMEDIATELY:  Following lower endoscopy (colonoscopy or flexible sigmoidoscopy):  Excessive amounts of blood in the stool  Significant tenderness or worsening of abdominal pains  Swelling of the abdomen that is new, acute  Fever of 100F or higher    For urgent or emergent issues, a gastroenterologist can be reached at any hour by calling [phone]. Do not use MyChart messaging for urgent concerns.    DIET:  We do recommend a small meal at first, but then you may proceed to your regular diet.  Drink plenty of fluids but you should avoid alcoholic beverages for 24 hours.  ACTIVITY:  You should plan to take it easy for the rest of today and you should NOT DRIVE or use heavy machinery until tomorrow (because  of the sedation medicines used during the test).    FOLLOW UP: Our staff will call the number listed on your records 48-72 hours following your procedure to check on you and address any questions or concerns that you may have regarding the information given to you following your procedure. If we do not reach you, we will leave a message.  We will attempt to reach you two times.  During this call, we will ask if you have developed any symptoms of COVID 19. If you develop any symptoms (ie: fever, flu-like symptoms, shortness of breath, cough etc.) before then, please call [phone].  If you test positive for Covid 19 in the 2 weeks post procedure, please call and report this information to us.    If any biopsies were taken you will be contacted by phone or by letter within the next 1-3 weeks.  Please call us at [phone] if you have not heard about the biopsies in 3 weeks.    SIGNATURES/CONFIDENTIALITY: You and/or your care partner have signed paperwork which will be entered into your electronic medical record.  These signatures attest to the fact that that the information above on your After Visit Summary has been reviewed and is understood.  Full responsibility of the confidentiality of this discharge information lies with you and/or your care-partner.    Resume medications. Information given on polyps. 

## 2020-08-02 NOTE — Progress Notes (Signed)
PT taken to PACU. Monitors in place. VSS. Report given to RN. 

## 2020-08-02 NOTE — Progress Notes (Signed)
Vital signs checked by:CW  The patient states no changes in medical or surgical history since pre-visit screening on 07/19/2020.

## 2020-08-03 ENCOUNTER — Ambulatory Visit: Payer: Medicare HMO

## 2020-08-04 ENCOUNTER — Telehealth: Payer: Self-pay | Admitting: *Deleted

## 2020-08-04 ENCOUNTER — Telehealth: Payer: Self-pay

## 2020-08-04 NOTE — Telephone Encounter (Signed)
  Follow up Call-  Call back number 08/02/2020  Post procedure Call Back phone  # [phone]  Permission to leave phone message Yes  Some recent data might be hidden     Patient questions:  Do you have a fever, pain , or abdominal swelling? No. Pain Score  0 *  Have you tolerated food without any problems? Yes.    Have you been able to return to your normal activities? Yes.    Do you have any questions about your discharge instructions: Diet   No. Medications  No. Follow up visit  No.  Do you have questions or concerns about your Care? No.  Actions: * If pain score is 4 or above: No action needed, pain <4. Have you developed a fever since your procedureno  2.   Have you had an respiratory symptoms (SOB or cough) since your procedure? no  3.   Have you tested positive for COVID 19 since your procedure no  4.   Have you had any family members/close contacts diagnosed with the COVID 19 since your procedure?  no   If yes to any of these questions please route to Joylene John, RN and Joella Prince, RN

## 2020-08-04 NOTE — Telephone Encounter (Signed)
Left message on f/u call 

## 2020-08-05 ENCOUNTER — Encounter: Payer: Self-pay | Admitting: Gastroenterology

## 2020-08-19 DIAGNOSIS — R030 Elevated blood-pressure reading, without diagnosis of hypertension: Secondary | ICD-10-CM | POA: Diagnosis not present

## 2020-08-19 DIAGNOSIS — G629 Polyneuropathy, unspecified: Secondary | ICD-10-CM | POA: Diagnosis not present

## 2020-08-19 DIAGNOSIS — Z6838 Body mass index (BMI) 38.0-38.9, adult: Secondary | ICD-10-CM | POA: Diagnosis not present

## 2020-08-19 DIAGNOSIS — G8929 Other chronic pain: Secondary | ICD-10-CM | POA: Diagnosis not present

## 2020-08-19 DIAGNOSIS — M25571 Pain in right ankle and joints of right foot: Secondary | ICD-10-CM | POA: Diagnosis not present

## 2020-08-19 DIAGNOSIS — E119 Type 2 diabetes mellitus without complications: Secondary | ICD-10-CM | POA: Diagnosis not present

## 2020-08-19 DIAGNOSIS — Z79899 Other long term (current) drug therapy: Secondary | ICD-10-CM | POA: Diagnosis not present

## 2020-08-26 ENCOUNTER — Other Ambulatory Visit: Payer: Self-pay | Admitting: Internal Medicine

## 2020-09-06 ENCOUNTER — Ambulatory Visit (INDEPENDENT_AMBULATORY_CARE_PROVIDER_SITE_OTHER): Payer: Medicare HMO | Admitting: Internal Medicine

## 2020-09-06 ENCOUNTER — Encounter: Payer: Self-pay | Admitting: Internal Medicine

## 2020-09-06 ENCOUNTER — Other Ambulatory Visit: Payer: Self-pay

## 2020-09-06 VITALS — BP 130/88 | HR 104 | Temp 98.1°F | Resp 18 | Ht 70.0 in | Wt 271.6 lb

## 2020-09-06 DIAGNOSIS — G894 Chronic pain syndrome: Secondary | ICD-10-CM | POA: Diagnosis not present

## 2020-09-06 DIAGNOSIS — E785 Hyperlipidemia, unspecified: Secondary | ICD-10-CM

## 2020-09-06 DIAGNOSIS — E1169 Type 2 diabetes mellitus with other specified complication: Secondary | ICD-10-CM | POA: Diagnosis not present

## 2020-09-06 LAB — POCT GLYCOSYLATED HEMOGLOBIN (HGB A1C): Hemoglobin A1C: 6.9 % — AB (ref 4.0–5.6)

## 2020-09-06 NOTE — Assessment & Plan Note (Signed)
POC HgA1c done today which is 6.9. He is doing well without low sugars. Will continue acarbose, januvia, jardiance, glipizide, metformin without change.

## 2020-09-06 NOTE — Progress Notes (Signed)
   Subjective:   Patient ID: Douglas Robinson, male    DOB: 08-Jun-1960, 60 y.o.   MRN: [id number]  Diabetes Pertinent negatives for diabetes include no chest pain.  The patient is a 60 YO man coming in for follow up diabetes. Doing well with diet. Some back problems with new heat sensation but no worsening pain and no numbness no rash.  Review of Systems  Constitutional: Negative.   HENT: Negative.    Eyes: Negative.   Respiratory:  Negative for cough, chest tightness and shortness of breath.   Cardiovascular:  Negative for chest pain, palpitations and leg swelling.  Gastrointestinal:  Negative for abdominal distention, abdominal pain, constipation, diarrhea, nausea and vomiting.  Musculoskeletal:  Positive for arthralgias and back pain.  Skin: Negative.   Neurological:  Positive for numbness.  Psychiatric/Behavioral: Negative.     Objective:  Physical Exam Constitutional:      Appearance: He is well-developed. He is obese.  HENT:     Head: Normocephalic and atraumatic.  Cardiovascular:     Rate and Rhythm: Normal rate and regular rhythm.  Pulmonary:     Effort: Pulmonary effort is normal. No respiratory distress.     Breath sounds: Normal breath sounds. No wheezing or rales.  Abdominal:     General: Bowel sounds are normal. There is no distension.     Palpations: Abdomen is soft.     Tenderness: There is no abdominal tenderness. There is no rebound.  Musculoskeletal:        General: Tenderness present.     Cervical back: Normal range of motion.     Comments: No rash on the back and stable tenderness  Skin:    General: Skin is warm and dry.  Neurological:     Mental Status: He is alert and oriented to person, place, and time.     Coordination: Coordination normal.    Vitals:   09/06/20 0822  BP: 130/88  Pulse: (!) 104  Resp: 18  Temp: 98.1 F (36.7 C)  TempSrc: Oral  SpO2: 94%  Weight: 271 lb 9.6 oz (123.2 kg)  Height: '5\' 10"'$  (1.778 m)    This visit occurred  during the SARS-CoV-2 public health emergency.  Safety protocols were in place, including screening questions prior to the visit, additional usage of staff PPE, and extensive cleaning of exam room while observing appropriate contact time as indicated for disinfecting solutions.   Assessment & Plan:

## 2020-09-06 NOTE — Assessment & Plan Note (Signed)
Overall stable and following with pain management. No rash to indicate shingles examined due to new heat sensation. Advised to monitor and use ice packs as needed.

## 2020-09-06 NOTE — Patient Instructions (Signed)
The HgA1c is 6.9.

## 2020-09-20 ENCOUNTER — Other Ambulatory Visit: Payer: Self-pay | Admitting: Internal Medicine

## 2020-09-21 DIAGNOSIS — G8929 Other chronic pain: Secondary | ICD-10-CM | POA: Diagnosis not present

## 2020-09-21 DIAGNOSIS — G894 Chronic pain syndrome: Secondary | ICD-10-CM | POA: Diagnosis not present

## 2020-09-21 DIAGNOSIS — M25571 Pain in right ankle and joints of right foot: Secondary | ICD-10-CM | POA: Diagnosis not present

## 2020-09-21 DIAGNOSIS — Z9189 Other specified personal risk factors, not elsewhere classified: Secondary | ICD-10-CM | POA: Diagnosis not present

## 2020-09-29 ENCOUNTER — Other Ambulatory Visit: Payer: Self-pay | Admitting: Internal Medicine

## 2020-10-19 DIAGNOSIS — R030 Elevated blood-pressure reading, without diagnosis of hypertension: Secondary | ICD-10-CM | POA: Diagnosis not present

## 2020-10-19 DIAGNOSIS — G894 Chronic pain syndrome: Secondary | ICD-10-CM | POA: Diagnosis not present

## 2020-10-19 DIAGNOSIS — G8929 Other chronic pain: Secondary | ICD-10-CM | POA: Diagnosis not present

## 2020-10-19 DIAGNOSIS — M25571 Pain in right ankle and joints of right foot: Secondary | ICD-10-CM | POA: Diagnosis not present

## 2020-10-19 DIAGNOSIS — Z79899 Other long term (current) drug therapy: Secondary | ICD-10-CM | POA: Diagnosis not present

## 2020-10-19 DIAGNOSIS — E119 Type 2 diabetes mellitus without complications: Secondary | ICD-10-CM | POA: Diagnosis not present

## 2020-10-19 DIAGNOSIS — G629 Polyneuropathy, unspecified: Secondary | ICD-10-CM | POA: Diagnosis not present

## 2020-11-03 DIAGNOSIS — H25813 Combined forms of age-related cataract, bilateral: Secondary | ICD-10-CM | POA: Diagnosis not present

## 2020-11-03 DIAGNOSIS — H524 Presbyopia: Secondary | ICD-10-CM | POA: Diagnosis not present

## 2020-11-03 DIAGNOSIS — E1136 Type 2 diabetes mellitus with diabetic cataract: Secondary | ICD-10-CM | POA: Diagnosis not present

## 2020-11-03 DIAGNOSIS — Z7984 Long term (current) use of oral hypoglycemic drugs: Secondary | ICD-10-CM | POA: Diagnosis not present

## 2020-11-21 ENCOUNTER — Other Ambulatory Visit: Payer: Self-pay

## 2020-11-21 ENCOUNTER — Ambulatory Visit (INDEPENDENT_AMBULATORY_CARE_PROVIDER_SITE_OTHER): Payer: Medicare HMO

## 2020-11-21 VITALS — BP 140/76 | HR 108 | Temp 98.0°F | Ht 70.0 in | Wt 276.0 lb

## 2020-11-21 DIAGNOSIS — Z0000 Encounter for general adult medical examination without abnormal findings: Secondary | ICD-10-CM | POA: Diagnosis not present

## 2020-11-21 NOTE — Patient Instructions (Signed)
Douglas Robinson , Thank you for taking time to come for your Medicare Wellness Visit. I appreciate your ongoing commitment to your health goals. Please review the following plan we discussed and let me know if I can assist you in the future.   Screening recommendations/referrals: Colonoscopy: 08/02/2020 Recommended yearly ophthalmology/optometry visit for glaucoma screening and checkup Recommended yearly dental visit for hygiene and checkup  Vaccinations: Influenza vaccine: completed  Pneumococcal vaccine: completed  Tdap vaccine: 11/06/2017 Shingles vaccine: will consider     Advanced directives: none   Conditions/risks identified: none   Next appointment: none   Preventive Care 33 Years and Older, Male Preventive care refers to lifestyle choices and visits with your health care provider that can promote health and wellness. What does preventive care include? A yearly physical exam. This is also called an annual well check. Dental exams once or twice a year. Routine eye exams. Ask your health care provider how often you should have your eyes checked. Personal lifestyle choices, including: Daily care of your teeth and gums. Regular physical activity. Eating a healthy diet. Avoiding tobacco and drug use. Limiting alcohol use. Practicing safe sex. Taking low doses of aspirin every day. Taking vitamin and mineral supplements as recommended by your health care provider. What happens during an annual well check? The services and screenings done by your health care provider during your annual well check will depend on your age, overall health, lifestyle risk factors, and family history of disease. Counseling  Your health care provider may ask you questions about your: Alcohol use. Tobacco use. Drug use. Emotional well-being. Home and relationship well-being. Sexual activity. Eating habits. History of falls. Memory and ability to understand (cognition). Work and work  Statistician. Screening  You may have the following tests or measurements: Height, weight, and BMI. Blood pressure. Lipid and cholesterol levels. These may be checked every 5 years, or more frequently if you are over 50 years old. Skin check. Lung cancer screening. You may have this screening every year starting at age 26 if you have a 30-pack-year history of smoking and currently smoke or have quit within the past 15 years. Fecal occult blood test (FOBT) of the stool. You may have this test every year starting at age 67. Flexible sigmoidoscopy or colonoscopy. You may have a sigmoidoscopy every 5 years or a colonoscopy every 10 years starting at age 17. Prostate cancer screening. Recommendations will vary depending on your family history and other risks. Hepatitis C blood test. Hepatitis B blood test. Sexually transmitted disease (STD) testing. Diabetes screening. This is done by checking your blood sugar (glucose) after you have not eaten for a while (fasting). You may have this done every 1-3 years. Abdominal aortic aneurysm (AAA) screening. You may need this if you are a current or former smoker. Osteoporosis. You may be screened starting at age 43 if you are at high risk. Talk with your health care provider about your test results, treatment options, and if necessary, the need for more tests. Vaccines  Your health care provider may recommend certain vaccines, such as: Influenza vaccine. This is recommended every year. Tetanus, diphtheria, and acellular pertussis (Tdap, Td) vaccine. You may need a Td booster every 10 years. Zoster vaccine. You may need this after age 24. Pneumococcal 13-valent conjugate (PCV13) vaccine. One dose is recommended after age 27. Pneumococcal polysaccharide (PPSV23) vaccine. One dose is recommended after age 62. Talk to your health care provider about which screenings and vaccines you need and how often you  need them. This information is not intended to replace  advice given to you by your health care provider. Make sure you discuss any questions you have with your health care provider. Document Released: 01/28/2015 Document Revised: 09/21/2015 Document Reviewed: 11/02/2014 Elsevier Interactive Patient Education  2017 [place] Prevention in the Home Falls can cause injuries. They can happen to people of all ages. There are many things you can do to make your home safe and to help prevent falls. What can I do on the outside of my home? Regularly fix the edges of walkways and driveways and fix any cracks. Remove anything that might make you trip as you walk through a door, such as a raised step or threshold. Trim any bushes or trees on the path to your home. Use bright outdoor lighting. Clear any walking paths of anything that might make someone trip, such as rocks or tools. Regularly check to see if handrails are loose or broken. Make sure that both sides of any steps have handrails. Any raised decks and porches should have guardrails on the edges. Have any leaves, snow, or ice cleared regularly. Use sand or salt on walking paths during winter. Clean up any spills in your garage right away. This includes oil or grease spills. What can I do in the bathroom? Use night lights. Install grab bars by the toilet and in the tub and shower. Do not use towel bars as grab bars. Use non-skid mats or decals in the tub or shower. If you need to sit down in the shower, use a plastic, non-slip stool. Keep the floor dry. Clean up any water that spills on the floor as soon as it happens. Remove soap buildup in the tub or shower regularly. Attach bath mats securely with double-sided non-slip rug tape. Do not have throw rugs and other things on the floor that can make you trip. What can I do in the bedroom? Use night lights. Make sure that you have a light by your bed that is easy to reach. Do not use any sheets or blankets that are too big for your bed.  They should not hang down onto the floor. Have a firm chair that has side arms. You can use this for support while you get dressed. Do not have throw rugs and other things on the floor that can make you trip. What can I do in the kitchen? Clean up any spills right away. Avoid walking on wet floors. Keep items that you use a lot in easy-to-reach places. If you need to reach something above you, use a strong step stool that has a grab bar. Keep electrical cords out of the way. Do not use floor polish or wax that makes floors slippery. If you must use wax, use non-skid floor wax. Do not have throw rugs and other things on the floor that can make you trip. What can I do with my stairs? Do not leave any items on the stairs. Make sure that there are handrails on both sides of the stairs and use them. Fix handrails that are broken or loose. Make sure that handrails are as long as the stairways. Check any carpeting to make sure that it is firmly attached to the stairs. Fix any carpet that is loose or worn. Avoid having throw rugs at the top or bottom of the stairs. If you do have throw rugs, attach them to the floor with carpet tape. Make sure that you have a light switch  at the top of the stairs and the bottom of the stairs. If you do not have them, ask someone to add them for you. What else can I do to help prevent falls? Wear shoes that: Do not have high heels. Have rubber bottoms. Are comfortable and fit you well. Are closed at the toe. Do not wear sandals. If you use a stepladder: Make sure that it is fully opened. Do not climb a closed stepladder. Make sure that both sides of the stepladder are locked into place. Ask someone to hold it for you, if possible. Clearly mark and make sure that you can see: Any grab bars or handrails. First and last steps. Where the edge of each step is. Use tools that help you move around (mobility aids) if they are needed. These  include: Canes. Walkers. Scooters. Crutches. Turn on the lights when you go into a dark area. Replace any light bulbs as soon as they burn out. Set up your furniture so you have a clear path. Avoid moving your furniture around. If any of your floors are uneven, fix them. If there are any pets around you, be aware of where they are. Review your medicines with your doctor. Some medicines can make you feel dizzy. This can increase your chance of falling. Ask your doctor what other things that you can do to help prevent falls. This information is not intended to replace advice given to you by your health care provider. Make sure you discuss any questions you have with your health care provider. Document Released: 10/28/2008 Document Revised: 06/09/2015 Document Reviewed: 02/05/2014 Elsevier Interactive Patient Education  2017 [employer].

## 2020-11-21 NOTE — Progress Notes (Signed)
Subjective:   Douglas Robinson is a 60 y.o. male who presents for an Initial Medicare Annual Wellness Visit.  Review of Systems           Objective:    There were no vitals filed for this visit. There is no height or weight on file to calculate BMI.  Advanced Directives 08/12/2019 08/06/2018 12/27/2014 12/14/2014 07/29/2013 07/29/2013  Does Patient Have a Medical Advance Directive? No No No No Patient would like information Patient does not have advance directive;Patient would not like information  Would patient like information on creating a medical advance directive? Yes (MAU/Ambulatory/Procedural Areas - Information given) Yes (ED - Information included in AVS) - - Advance directive brochure given (Outpatient ONLY) -    Current Medications (verified) Outpatient Encounter Medications as of 11/21/2020  Medication Sig   acarbose (PRECOSE) 50 MG tablet TAKE 1 TABLET THREE TIMES DAILY WITH MEALS   ACCU-CHEK AVIVA PLUS test strip TEST BLOOD SUGAR TWICE DAILY (Patient not taking: No sig reported)   Accu-Chek Softclix Lancets lancets Use to check glucose as needed E11.9 (Patient not taking: No sig reported)   albuterol (VENTOLIN HFA) 108 (90 Base) MCG/ACT inhaler INHALE 1 TO 2 PUFFS EVERY 6 HOURS AS NEEDED FOR WHEEZING  OR FOR SHORTNESS OF BREATH   aspirin 81 MG tablet Take 81 mg by mouth daily.   glipiZIDE (GLUCOTROL XL) 10 MG 24 hr tablet TAKE 1 TABLET EVERY DAY  WITH  BREAKFAST   hydrochlorothiazide (HYDRODIURIL) 25 MG tablet TAKE 1 TABLET EVERY DAY   HYDROcodone-acetaminophen (NORCO) 10-325 MG per tablet Take 1 tablet by mouth every 6 (six) hours as needed.   irbesartan (AVAPRO) 300 MG tablet TAKE 1 TABLET EVERY DAY   JANUVIA 100 MG tablet TAKE 1 TABLET (100 MG TOTAL) BY MOUTH DAILY.   JARDIANCE 25 MG TABS tablet TAKE 1 TABLET EVERY DAY   metFORMIN (GLUCOPHAGE) 1000 MG tablet TAKE 1 TABLET TWICE DAILY WITH MEALS   mirtazapine (REMERON) 45 MG tablet TAKE 1 TABLET (45 MG TOTAL) BY MOUTH  AT BEDTIME. DISCONTINUE ANY OTHER DOSES OF REMERON.   Multiple Vitamin (MULTIVITAMIN) tablet Take 1 tablet by mouth daily.   ofloxacin (OCUFLOX) 0.3 % ophthalmic solution  (Patient not taking: No sig reported)   pantoprazole (PROTONIX) 20 MG tablet Take 1 tablet (20 mg total) by mouth daily.   pravastatin (PRAVACHOL) 10 MG tablet TAKE 1 TABLET EVERY DAY   PRODIGY TWIST TOP LANCETS 28G MISC Use one lancet at each glucose check, as needed. (Patient not taking: No sig reported)   traZODone (DESYREL) 100 MG tablet Take 1 tablet by mouth at bedtime.   vitamin B-12 (CYANOCOBALAMIN) 1000 MCG tablet Take 1,000 mcg by mouth daily.   No facility-administered encounter medications on file as of 11/21/2020.    Allergies (verified) Naproxen   History: Past Medical History:  Diagnosis Date   Anxiety    Cataract    COPD (chronic obstructive pulmonary disease) (HCC)    Diabetes ([place])    GERD (gastroesophageal reflux disease)    HTN (hypertension)    Hyperlipidemia    OSA on CPAP    Sleep apnea    Past Surgical History:  Procedure Laterality Date   CARPAL TUNNEL RELEASE Right    COLONOSCOPY  2013   HERNIA REPAIR     POLYPECTOMY  2013   4 polyps-TA   [place]   "Sometime before 1996"   Family History  Problem Relation Age of Onset  Cancer - Prostate Father    Colon polyps Father    Colon cancer Neg Hx    Rectal cancer Neg Hx    Stomach cancer Neg Hx    Esophageal cancer Neg Hx    Social History   Socioeconomic History   Marital status: Married    Spouse name: Not on file   Number of children: Not on file   Years of education: Not on file   Highest education level: Not on file  Occupational History   Occupation: disabilty  Tobacco Use   Smoking status: Never    Passive exposure: Never   Smokeless tobacco: Never  Vaping Use   Vaping Use: Never used  Substance and Sexual Activity   Alcohol use: No    Alcohol/week: 0.0 standard drinks   Drug use: No    Sexual activity: Yes  Other Topics Concern   Not on file  Social History Narrative   Not on file   Social Determinants of Health   Financial Resource Strain: Not on file  Food Insecurity: Not on file  Transportation Needs: Not on file  Physical Activity: Not on file  Stress: Not on file  Social Connections: Not on file    Tobacco Counseling Counseling given: Not Answered   Clinical Intake:                 Diabetic?yes Nutrition Risk Assessment:  Has the patient had any N/V/D within the last 2 months?  No  Does the patient have any non-healing wounds?  No  Has the patient had any unintentional weight loss or weight gain?  No   Diabetes:  Is the patient diabetic?  Yes  If diabetic, was a CBG obtained today?  No  Did the patient bring in their glucometer from home?  No  How often do you monitor your CBG's? Declines   Financial Strains and Diabetes Management:  Are you having any financial strains with the device, your supplies or your medication? No .  Does the patient want to be seen by Chronic Care Management for management of their diabetes?  No  Would the patient like to be referred to a Nutritionist or for Diabetic Management?  No   Diabetic Exams:  Diabetic Eye Exam: Completed 10/2020 Diabetic Foot Exam: Overdue, Pt has been advised about the importance in completing this exam. Pt is scheduled for diabetic foot exam on next office visit .          Activities of Daily Living No flowsheet data found.  Patient Care Team: Hoyt Koch, MD as PCP - General (Internal Medicine) Milus Banister, MD as Attending Physician (Gastroenterology) Rutherford Guys, MD as Consulting Physician (Ophthalmology) Jeanella Anton, NP as Nurse Practitioner (Nurse Practitioner) Tomasa Blase, [facility] as Pharmacist (Pharmacist)  Indicate any recent Medical Services you may have received from other than [facility] providers in the past year (date may be  approximate).     Assessment:   This is a routine wellness examination for Douglas Robinson.  Hearing/Vision screen No results found.  Dietary issues and exercise activities discussed:     Goals Addressed   None    Depression Screen PHQ 2/9 Scores 05/06/2020 08/12/2019 08/06/2018 09/30/2017 08/29/2017 07/29/2013  PHQ - 2 Score 0 0 1 3 1  0  PHQ- 9 Score - - 3 12 - -    Fall Risk Fall Risk  05/06/2020 08/12/2019 08/06/2018 07/29/2013  Falls in the past year? 0 0 0 No  Number falls in  past yr: 0 0 0 -  Injury with Fall? 0 0 - -  Risk for fall due to : - No Fall Risks Impaired mobility;Impaired balance/gait -  Follow up - Falls evaluation completed Falls prevention discussed -    FALL RISK PREVENTION PERTAINING TO THE HOME:  Any stairs in or around the home? No  If so, are there any without handrails? No  Home free of loose throw rugs in walkways, pet beds, electrical cords, etc? Yes  Adequate lighting in your home to reduce risk of falls? Yes   ASSISTIVE DEVICES UTILIZED TO PREVENT FALLS:  Life alert? No  Use of a cane, walker or w/c? No  Grab bars in the bathroom? No  Shower chair or bench in shower? No  Elevated toilet seat or a handicapped toilet? No   TIMED UP AND GO:  Was the test performed? Yes .  Length of time to ambulate 10 feet: 10 sec.   Gait steady and fast without use of assistive device  Cognitive Function:    Normal cognitive status assessed by direct observation by this Nurse Health Advisor. No abnormalities found.      Immunizations Immunization History  Administered Date(s) Administered   Fluad Quad(high Dose 65+) 10/10/2018   Influenza,inj,Quad PF,6+ Mos 09/25/2017, 09/23/2018   Influenza-Unspecified 09/23/2017, 11/16/2019   Moderna Sars-Covid-2 Vaccination 05/19/2019, 06/16/2019, 01/25/2020   PFIZER SARS-COV-2 Pediatric Vaccination 5-12yrs 07/28/2020   Pneumococcal Polysaccharide-23 09/13/2011   Tdap 11/06/2017    TDAP status: Up to date  Flu  Vaccine status: Up to date  Pneumococcal vaccine status: Up to date  Covid-19 vaccine status: Completed vaccines  Qualifies for Shingles Vaccine? Yes   Zostavax completed No   Shingrix Completed?: Yes  Screening Tests Health Maintenance  Topic Date Due   HIV Screening  Never done   Zoster Vaccines- Shingrix (1 of 2) Never done   Pneumococcal Vaccine 22-45 Years old (2 - PCV) 09/12/2012   OPHTHALMOLOGY EXAM  10/27/2019   INFLUENZA VACCINE  08/15/2020   COVID-19 Vaccine (4 - Booster for Moderna series) 09/22/2020   FOOT EXAM  01/25/2021   HEMOGLOBIN A1C  03/09/2021   COLONOSCOPY (Pts 45-33yrs Insurance coverage will need to be confirmed)  08/03/2027   TETANUS/TDAP  11/07/2027   Hepatitis C Screening  Completed   HPV VACCINES  Aged Out    Health Maintenance  Health Maintenance Due  Topic Date Due   HIV Screening  Never done   Zoster Vaccines- Shingrix (1 of 2) Never done   Pneumococcal Vaccine 57-54 Years old (2 - PCV) 09/12/2012   OPHTHALMOLOGY EXAM  10/27/2019   INFLUENZA VACCINE  08/15/2020   COVID-19 Vaccine (4 - Booster for Moderna series) 09/22/2020    Colorectal cancer screening: Type of screening: Colonoscopy. Completed 08/02/2020. Repeat every 7 years  Lung Cancer Screening: (Low Dose CT Chest recommended if Age 71-80 years, 30 pack-year currently smoking OR have quit w/in 15years.) does not qualify.   Lung Cancer Screening Referral: n/a  Additional Screening:  Hepatitis C Screening: does not qualify; Completed 09/08/2017  Vision Screening: Recommended annual ophthalmology exams for early detection of glaucoma and other disorders of the eye. Is the patient up to date with their annual eye exam?  Yes  Who is the provider or what is the name of the office in which the patient attends annual eye exams? Dr.Groat  If pt is not established with a provider, would they like to be referred to a provider to establish care? No .  Dental Screening: Recommended annual  dental exams for proper oral hygiene  Community Resource Referral / Chronic Care Management: CRR required this visit?  No   CCM required this visit?  No      Plan:     I have personally reviewed and noted the following in the patient's chart:   Medical and social history Use of alcohol, tobacco or illicit drugs  Current medications and supplements including opioid prescriptions. Patient is currently taking opioid prescriptions. Information provided to patient regarding non-opioid alternatives. Patient advised to discuss non-opioid treatment plan with their provider. Functional ability and status Nutritional status Physical activity Advanced directives List of other physicians Hospitalizations, surgeries, and ER visits in previous 12 months Vitals Screenings to include cognitive, depression, and falls Referrals and appointments  In addition, I have reviewed and discussed with patient certain preventive protocols, quality metrics, and best practice recommendations. A written personalized care plan for preventive services as well as general preventive health recommendations were provided to patient.     Randel Pigg, LPN   80/02/[id number]   Nurse Notes: none

## 2020-11-22 ENCOUNTER — Ambulatory Visit: Payer: Medicare HMO

## 2020-12-04 ENCOUNTER — Other Ambulatory Visit: Payer: Self-pay | Admitting: Internal Medicine

## 2020-12-20 DIAGNOSIS — Z6839 Body mass index (BMI) 39.0-39.9, adult: Secondary | ICD-10-CM | POA: Diagnosis not present

## 2020-12-20 DIAGNOSIS — Z79899 Other long term (current) drug therapy: Secondary | ICD-10-CM | POA: Diagnosis not present

## 2020-12-20 DIAGNOSIS — G894 Chronic pain syndrome: Secondary | ICD-10-CM | POA: Diagnosis not present

## 2020-12-20 DIAGNOSIS — R030 Elevated blood-pressure reading, without diagnosis of hypertension: Secondary | ICD-10-CM | POA: Diagnosis not present

## 2020-12-20 DIAGNOSIS — G629 Polyneuropathy, unspecified: Secondary | ICD-10-CM | POA: Diagnosis not present

## 2020-12-28 ENCOUNTER — Encounter: Payer: Self-pay | Admitting: Internal Medicine

## 2020-12-28 ENCOUNTER — Ambulatory Visit (INDEPENDENT_AMBULATORY_CARE_PROVIDER_SITE_OTHER): Payer: Medicare HMO | Admitting: Internal Medicine

## 2020-12-28 ENCOUNTER — Other Ambulatory Visit: Payer: Self-pay

## 2020-12-28 VITALS — BP 116/80 | HR 94 | Resp 18 | Ht 70.0 in | Wt 278.6 lb

## 2020-12-28 DIAGNOSIS — G4733 Obstructive sleep apnea (adult) (pediatric): Secondary | ICD-10-CM | POA: Diagnosis not present

## 2020-12-28 DIAGNOSIS — E1169 Type 2 diabetes mellitus with other specified complication: Secondary | ICD-10-CM

## 2020-12-28 DIAGNOSIS — G894 Chronic pain syndrome: Secondary | ICD-10-CM

## 2020-12-28 DIAGNOSIS — E785 Hyperlipidemia, unspecified: Secondary | ICD-10-CM | POA: Diagnosis not present

## 2020-12-28 DIAGNOSIS — Z9989 Dependence on other enabling machines and devices: Secondary | ICD-10-CM

## 2020-12-28 LAB — POCT GLYCOSYLATED HEMOGLOBIN (HGB A1C): Hemoglobin A1C: 6.9 % — AB (ref 4.0–5.6)

## 2020-12-28 NOTE — Assessment & Plan Note (Signed)
POC Hga1c done today stable at 6.9. Will continue [place] and jardiance and metformin and glipizide and acarbose. Is on statin and ARB.

## 2020-12-28 NOTE — Assessment & Plan Note (Signed)
Unable to obtain CPAP supplies so needs new device. They feel device is >62 years old not sure of exact date. Ordered new CPAP device.

## 2020-12-28 NOTE — Progress Notes (Signed)
° °  Subjective:   Patient ID: Douglas Robinson, male    DOB: Dec 27, 1960, 60 y.o.   MRN: [id number]  HPI The patient is a 60 YO man coming in for follow up sugars.  HgA1c 6.9  Review of Systems  Constitutional: Negative.   HENT: Negative.    Eyes: Negative.   Respiratory:  Negative for cough, chest tightness and shortness of breath.   Cardiovascular:  Negative for chest pain, palpitations and leg swelling.  Gastrointestinal:  Negative for abdominal distention, abdominal pain, constipation, diarrhea, nausea and vomiting.  Musculoskeletal:  Positive for arthralgias and myalgias.  Skin: Negative.   Neurological: Negative.   Psychiatric/Behavioral: Negative.     Objective:  Physical Exam Constitutional:      Appearance: He is well-developed.  HENT:     Head: Normocephalic and atraumatic.  Cardiovascular:     Rate and Rhythm: Normal rate and regular rhythm.  Pulmonary:     Effort: Pulmonary effort is normal. No respiratory distress.     Breath sounds: Normal breath sounds. No wheezing or rales.  Abdominal:     General: Bowel sounds are normal. There is no distension.     Palpations: Abdomen is soft.     Tenderness: There is no abdominal tenderness. There is no rebound.  Musculoskeletal:        General: Tenderness present.     Cervical back: Normal range of motion.  Skin:    General: Skin is warm and dry.  Neurological:     Mental Status: He is alert and oriented to person, place, and time.     Coordination: Coordination normal.    Vitals:   12/28/20 0843  BP: 116/80  Pulse: 94  Resp: 18  SpO2: 95%  Weight: 278 lb 9.6 oz (126.4 kg)  Height: 5\' 10"  (1.778 m)    This visit occurred during the SARS-CoV-2 public health emergency.  Safety protocols were in place, including screening questions prior to the visit, additional usage of staff PPE, and extensive cleaning of exam room while observing appropriate contact time as indicated for disinfecting solutions.   Assessment &  Plan:

## 2020-12-28 NOTE — Assessment & Plan Note (Signed)
Some worsening of the foot pain his pain specialist feels is related to cold weather.

## 2020-12-28 NOTE — Patient Instructions (Signed)
We have ordered the CPAP device for you.  Your HgA1c is 6.9 today which is good.

## 2021-02-07 ENCOUNTER — Other Ambulatory Visit: Payer: Self-pay | Admitting: Internal Medicine

## 2021-02-20 DIAGNOSIS — Z79899 Other long term (current) drug therapy: Secondary | ICD-10-CM | POA: Diagnosis not present

## 2021-02-20 DIAGNOSIS — E119 Type 2 diabetes mellitus without complications: Secondary | ICD-10-CM | POA: Diagnosis not present

## 2021-02-20 DIAGNOSIS — R030 Elevated blood-pressure reading, without diagnosis of hypertension: Secondary | ICD-10-CM | POA: Diagnosis not present

## 2021-02-20 DIAGNOSIS — G629 Polyneuropathy, unspecified: Secondary | ICD-10-CM | POA: Diagnosis not present

## 2021-02-20 DIAGNOSIS — G894 Chronic pain syndrome: Secondary | ICD-10-CM | POA: Diagnosis not present

## 2021-02-20 DIAGNOSIS — E559 Vitamin D deficiency, unspecified: Secondary | ICD-10-CM | POA: Diagnosis not present

## 2021-02-20 DIAGNOSIS — Z6838 Body mass index (BMI) 38.0-38.9, adult: Secondary | ICD-10-CM | POA: Diagnosis not present

## 2021-03-02 ENCOUNTER — Other Ambulatory Visit: Payer: Self-pay | Admitting: Internal Medicine

## 2021-03-21 DIAGNOSIS — Z79899 Other long term (current) drug therapy: Secondary | ICD-10-CM | POA: Diagnosis not present

## 2021-03-21 DIAGNOSIS — E559 Vitamin D deficiency, unspecified: Secondary | ICD-10-CM | POA: Diagnosis not present

## 2021-03-21 DIAGNOSIS — R030 Elevated blood-pressure reading, without diagnosis of hypertension: Secondary | ICD-10-CM | POA: Diagnosis not present

## 2021-03-21 DIAGNOSIS — E119 Type 2 diabetes mellitus without complications: Secondary | ICD-10-CM | POA: Diagnosis not present

## 2021-03-21 DIAGNOSIS — Z6839 Body mass index (BMI) 39.0-39.9, adult: Secondary | ICD-10-CM | POA: Diagnosis not present

## 2021-03-21 DIAGNOSIS — G629 Polyneuropathy, unspecified: Secondary | ICD-10-CM | POA: Diagnosis not present

## 2021-03-21 DIAGNOSIS — G894 Chronic pain syndrome: Secondary | ICD-10-CM | POA: Diagnosis not present

## 2021-04-10 ENCOUNTER — Other Ambulatory Visit: Payer: Self-pay

## 2021-04-10 ENCOUNTER — Encounter: Payer: Self-pay | Admitting: Internal Medicine

## 2021-04-10 ENCOUNTER — Ambulatory Visit (INDEPENDENT_AMBULATORY_CARE_PROVIDER_SITE_OTHER): Payer: Medicare HMO | Admitting: Internal Medicine

## 2021-04-10 VITALS — BP 118/78 | HR 99 | Resp 18 | Ht 70.0 in | Wt 271.8 lb

## 2021-04-10 DIAGNOSIS — R7989 Other specified abnormal findings of blood chemistry: Secondary | ICD-10-CM

## 2021-04-10 DIAGNOSIS — I10 Essential (primary) hypertension: Secondary | ICD-10-CM

## 2021-04-10 DIAGNOSIS — J4520 Mild intermittent asthma, uncomplicated: Secondary | ICD-10-CM

## 2021-04-10 DIAGNOSIS — G4733 Obstructive sleep apnea (adult) (pediatric): Secondary | ICD-10-CM

## 2021-04-10 DIAGNOSIS — Z0000 Encounter for general adult medical examination without abnormal findings: Secondary | ICD-10-CM | POA: Diagnosis not present

## 2021-04-10 DIAGNOSIS — E785 Hyperlipidemia, unspecified: Secondary | ICD-10-CM | POA: Diagnosis not present

## 2021-04-10 DIAGNOSIS — K219 Gastro-esophageal reflux disease without esophagitis: Secondary | ICD-10-CM | POA: Diagnosis not present

## 2021-04-10 DIAGNOSIS — F5101 Primary insomnia: Secondary | ICD-10-CM

## 2021-04-10 DIAGNOSIS — E1169 Type 2 diabetes mellitus with other specified complication: Secondary | ICD-10-CM

## 2021-04-10 DIAGNOSIS — Z9989 Dependence on other enabling machines and devices: Secondary | ICD-10-CM

## 2021-04-10 DIAGNOSIS — J449 Chronic obstructive pulmonary disease, unspecified: Secondary | ICD-10-CM | POA: Diagnosis not present

## 2021-04-10 DIAGNOSIS — G894 Chronic pain syndrome: Secondary | ICD-10-CM | POA: Diagnosis not present

## 2021-04-10 LAB — COMPREHENSIVE METABOLIC PANEL
ALT: 91 U/L — ABNORMAL HIGH (ref 0–53)
AST: 49 U/L — ABNORMAL HIGH (ref 0–37)
Albumin: 4.7 g/dL (ref 3.5–5.2)
Alkaline Phosphatase: 50 U/L (ref 39–117)
BUN: 11 mg/dL (ref 6–23)
CO2: 32 mEq/L (ref 19–32)
Calcium: 9.9 mg/dL (ref 8.4–10.5)
Chloride: 102 mEq/L (ref 96–112)
Creatinine, Ser: 0.73 mg/dL (ref 0.40–1.50)
GFR: 97.56 mL/min (ref >60.00)
Glucose, Bld: 146 mg/dL — ABNORMAL HIGH (ref 70–99)
Potassium: 4.1 mEq/L (ref 3.5–5.1)
Sodium: 139 mEq/L (ref 135–145)
Total Bilirubin: 1.3 mg/dL — ABNORMAL HIGH (ref 0.2–1.2)
Total Protein: 7.8 g/dL (ref 6.0–8.3)

## 2021-04-10 LAB — CBC
HCT: 45.4 % (ref 39.0–52.0)
Hemoglobin: 14.9 g/dL (ref 13.0–17.0)
MCHC: 32.9 g/dL (ref 30.0–36.0)
MCV: 88.7 fl (ref 78.0–100.0)
Platelets: 169 10*3/uL (ref 150.0–400.0)
RBC: 5.11 Mil/uL (ref 4.22–5.81)
RDW: 15.4 % (ref 11.5–15.5)
WBC: 6.1 10*3/uL (ref 4.0–10.5)

## 2021-04-10 LAB — LIPID PANEL
Cholesterol: 132 mg/dL (ref 0–200)
HDL: 50.9 mg/dL (ref >39.00)
LDL Cholesterol: 69 mg/dL (ref 0–99)
NonHDL: 80.84
Total CHOL/HDL Ratio: 3
Triglycerides: 61 mg/dL (ref 0.0–149.0)
VLDL: 12.2 mg/dL (ref 0.0–40.0)

## 2021-04-10 LAB — MICROALBUMIN / CREATININE URINE RATIO
Creatinine,U: 108.5 mg/dL
Microalb Creat Ratio: 0.6 mg/g (ref 0.0–30.0)
Microalb, Ur: 0.7 mg/dL (ref 0.0–1.9)

## 2021-04-10 LAB — HEMOGLOBIN A1C: Hgb A1c MFr Bld: 6.9 % — ABNORMAL HIGH (ref 4.6–6.5)

## 2021-04-10 NOTE — Assessment & Plan Note (Signed)
He is not satisfied with current provider as he feels they are trying to provide his primary care and ordering labs outside of ones he feels are needed. He wishes to change providers and referral done today. Advised to stay with current pain management provider until new one can be arranged. He is on disability related to prior injury and the pain he experiences.  ?

## 2021-04-10 NOTE — Assessment & Plan Note (Signed)
Had stabilized previous and will continue monitoring LFTs yearly at least, ordered CMP today.  ?

## 2021-04-10 NOTE — Assessment & Plan Note (Signed)
Checking HgA1c, lipid panel, microalbumin to creatinine ratio and CMP. Previously at goal on current regimen acarbose 50 mg TID with meals, glipizide 10 mg daily, januvia 100 mg daily, jardiance 25 mg daily, metformin 1000 mg BID. Is taking ARB and statin. Adjust as needed. Foot exam done. Getting records from eye exam which patient states is up to date. Complicated by neuropathy.  ?

## 2021-04-10 NOTE — Assessment & Plan Note (Signed)
BP at goal today and will maintain current regimen of irbesartan 300 mg daily. Checking CMP and adjust if needed.  ?

## 2021-04-10 NOTE — Assessment & Plan Note (Signed)
Symptoms are well controlled on protonix 20 mg daily. Will refill and continue at current dose as he had moderate symptoms off medication.  ?

## 2021-04-10 NOTE — Progress Notes (Signed)
? ?  Subjective:  ? ?Patient ID: Douglas Robinson, male    DOB: 1960/12/24, 61 y.o.   MRN: [id number] ? ?HPI ?The patient is here for physical. Dr. Gershon Crane eyes ? ?PMH, [facility], social history reviewed and updated ? ?Review of Systems  ?Constitutional: Negative.   ?HENT: Negative.    ?Eyes: Negative.   ?Respiratory:  Negative for cough, chest tightness and shortness of breath.   ?Cardiovascular:  Negative for chest pain, palpitations and leg swelling.  ?Gastrointestinal:  Negative for abdominal distention, abdominal pain, constipation, diarrhea, nausea and vomiting.  ?Musculoskeletal:  Positive for arthralgias and myalgias.  ?Skin: Negative.   ?Neurological: Negative.   ?Psychiatric/Behavioral: Negative.    ? ?Objective:  ?Physical Exam ?Constitutional:   ?   Appearance: He is well-developed.  ?HENT:  ?   Head: Normocephalic and atraumatic.  ?Cardiovascular:  ?   Rate and Rhythm: Normal rate and regular rhythm.  ?Pulmonary:  ?   Effort: Pulmonary effort is normal. No respiratory distress.  ?   Breath sounds: Normal breath sounds. No wheezing or rales.  ?Abdominal:  ?   General: Bowel sounds are normal. There is no distension.  ?   Palpations: Abdomen is soft.  ?   Tenderness: There is no abdominal tenderness. There is no rebound.  ?Musculoskeletal:     ?   General: Tenderness present.  ?   Cervical back: Normal range of motion.  ?Skin: ?   General: Skin is warm and dry.  ?   Comments: Foot exam done  ?Neurological:  ?   Mental Status: He is alert and oriented to person, place, and time.  ?   Coordination: Coordination normal.  ? ? ?Vitals:  ? 04/10/21 0756  ?BP: 118/78  ?Pulse: 99  ?Resp: 18  ?SpO2: 99%  ?Weight: 271 lb 12.8 oz (123.3 kg)  ?Height: '5\' 10"'$  (1.778 m)  ? ? ?This visit occurred during the SARS-CoV-2 public health emergency.  Safety protocols were in place, including screening questions prior to the visit, additional usage of staff PPE, and extensive cleaning of exam room while observing appropriate contact  time as indicated for disinfecting solutions.  ? ?Assessment & Plan:  ? ?

## 2021-04-10 NOTE — Assessment & Plan Note (Signed)
Uses albuterol prn and no flare today. Typically flared up with seasonal allergies which are under good control.  ?

## 2021-04-10 NOTE — Assessment & Plan Note (Signed)
Checking lipid panel and adjust pravastatin 10 mg daily as needed for LDL <100.  ?

## 2021-04-10 NOTE — Assessment & Plan Note (Signed)
Flu shot up to date. Covid-19 up to date. Pneumonia due at 59. Shingrix pt states complete. Tetanus up to date. Colonoscopy up to date. Counseled about sun safety and mole surveillance. Counseled about the dangers of distracted driving. Given 10 year screening recommendations.  ? ?

## 2021-04-10 NOTE — Assessment & Plan Note (Signed)
Still using regularly.  ?

## 2021-04-10 NOTE — Assessment & Plan Note (Signed)
Uses trazodone 100 mg qhs and taking remeron 45 mg qhs. Overall sleeping okay and will continue current regimen. ?

## 2021-04-19 DIAGNOSIS — F1120 Opioid dependence, uncomplicated: Secondary | ICD-10-CM | POA: Diagnosis not present

## 2021-04-19 DIAGNOSIS — Z6839 Body mass index (BMI) 39.0-39.9, adult: Secondary | ICD-10-CM | POA: Diagnosis not present

## 2021-04-19 DIAGNOSIS — R030 Elevated blood-pressure reading, without diagnosis of hypertension: Secondary | ICD-10-CM | POA: Diagnosis not present

## 2021-04-19 DIAGNOSIS — G894 Chronic pain syndrome: Secondary | ICD-10-CM | POA: Diagnosis not present

## 2021-04-19 DIAGNOSIS — E119 Type 2 diabetes mellitus without complications: Secondary | ICD-10-CM | POA: Diagnosis not present

## 2021-04-19 DIAGNOSIS — Z79899 Other long term (current) drug therapy: Secondary | ICD-10-CM | POA: Diagnosis not present

## 2021-04-19 DIAGNOSIS — G629 Polyneuropathy, unspecified: Secondary | ICD-10-CM | POA: Diagnosis not present

## 2021-04-19 DIAGNOSIS — E559 Vitamin D deficiency, unspecified: Secondary | ICD-10-CM | POA: Diagnosis not present

## 2021-04-25 ENCOUNTER — Telehealth: Payer: Self-pay | Admitting: Internal Medicine

## 2021-04-25 NOTE — Telephone Encounter (Signed)
Pt is requesting new pain management MD that specializes in pain and controlled substances at [facility]. ? ? ?MD referred to pain clinic, but he does not need that service and pt is on Disability.  ?

## 2021-05-06 ENCOUNTER — Other Ambulatory Visit: Payer: Self-pay | Admitting: Internal Medicine

## 2021-05-13 ENCOUNTER — Other Ambulatory Visit: Payer: Self-pay | Admitting: Internal Medicine

## 2021-05-16 ENCOUNTER — Telehealth: Payer: Self-pay | Admitting: Internal Medicine

## 2021-05-16 MED ORDER — ALBUTEROL SULFATE HFA 108 (90 BASE) MCG/ACT IN AERS: INHALATION_SPRAY | RESPIRATORY_TRACT | 1 refills | Status: DC

## 2021-05-16 NOTE — Telephone Encounter (Signed)
1.Medication Requested: albuterol (VENTOLIN HFA) 108 (90 Base) MCG/ACT inhaler ? ?2. Pharmacy (Name, [place], [facility]): [place], [place] ? ?3. On Med List: Y ? ?4. Last Visit with PCP: 04-10-2021 ? ?5. Next visit date with PCP: n/a ? ? ?Agent: Please be advised that RX refills may take up to 3 business days. We ask that you follow-up with your pharmacy.  ?

## 2021-05-16 NOTE — Telephone Encounter (Signed)
Refill has been sent to the patient's mail order pharmacy.  ?

## 2021-05-19 DIAGNOSIS — R030 Elevated blood-pressure reading, without diagnosis of hypertension: Secondary | ICD-10-CM | POA: Diagnosis not present

## 2021-05-19 DIAGNOSIS — E119 Type 2 diabetes mellitus without complications: Secondary | ICD-10-CM | POA: Diagnosis not present

## 2021-05-19 DIAGNOSIS — G629 Polyneuropathy, unspecified: Secondary | ICD-10-CM | POA: Diagnosis not present

## 2021-05-19 DIAGNOSIS — E559 Vitamin D deficiency, unspecified: Secondary | ICD-10-CM | POA: Diagnosis not present

## 2021-05-19 DIAGNOSIS — Z6838 Body mass index (BMI) 38.0-38.9, adult: Secondary | ICD-10-CM | POA: Diagnosis not present

## 2021-05-19 DIAGNOSIS — Z79899 Other long term (current) drug therapy: Secondary | ICD-10-CM | POA: Diagnosis not present

## 2021-05-19 DIAGNOSIS — G894 Chronic pain syndrome: Secondary | ICD-10-CM | POA: Diagnosis not present

## 2021-06-20 DIAGNOSIS — R030 Elevated blood-pressure reading, without diagnosis of hypertension: Secondary | ICD-10-CM | POA: Diagnosis not present

## 2021-06-20 DIAGNOSIS — Z79899 Other long term (current) drug therapy: Secondary | ICD-10-CM | POA: Diagnosis not present

## 2021-06-20 DIAGNOSIS — E559 Vitamin D deficiency, unspecified: Secondary | ICD-10-CM | POA: Diagnosis not present

## 2021-06-20 DIAGNOSIS — G894 Chronic pain syndrome: Secondary | ICD-10-CM | POA: Diagnosis not present

## 2021-06-20 DIAGNOSIS — Z6838 Body mass index (BMI) 38.0-38.9, adult: Secondary | ICD-10-CM | POA: Diagnosis not present

## 2021-06-20 DIAGNOSIS — E119 Type 2 diabetes mellitus without complications: Secondary | ICD-10-CM | POA: Diagnosis not present

## 2021-06-20 DIAGNOSIS — G629 Polyneuropathy, unspecified: Secondary | ICD-10-CM | POA: Diagnosis not present

## 2021-07-05 ENCOUNTER — Other Ambulatory Visit: Payer: Self-pay | Admitting: Internal Medicine

## 2021-07-20 DIAGNOSIS — R030 Elevated blood-pressure reading, without diagnosis of hypertension: Secondary | ICD-10-CM | POA: Diagnosis not present

## 2021-07-20 DIAGNOSIS — E559 Vitamin D deficiency, unspecified: Secondary | ICD-10-CM | POA: Diagnosis not present

## 2021-07-20 DIAGNOSIS — G629 Polyneuropathy, unspecified: Secondary | ICD-10-CM | POA: Diagnosis not present

## 2021-07-20 DIAGNOSIS — Z6838 Body mass index (BMI) 38.0-38.9, adult: Secondary | ICD-10-CM | POA: Diagnosis not present

## 2021-07-20 DIAGNOSIS — E119 Type 2 diabetes mellitus without complications: Secondary | ICD-10-CM | POA: Diagnosis not present

## 2021-07-20 DIAGNOSIS — Z79899 Other long term (current) drug therapy: Secondary | ICD-10-CM | POA: Diagnosis not present

## 2021-07-20 DIAGNOSIS — F1120 Opioid dependence, uncomplicated: Secondary | ICD-10-CM | POA: Diagnosis not present

## 2021-07-20 DIAGNOSIS — G894 Chronic pain syndrome: Secondary | ICD-10-CM | POA: Diagnosis not present

## 2021-08-18 DIAGNOSIS — E559 Vitamin D deficiency, unspecified: Secondary | ICD-10-CM | POA: Diagnosis not present

## 2021-08-18 DIAGNOSIS — G894 Chronic pain syndrome: Secondary | ICD-10-CM | POA: Diagnosis not present

## 2021-08-18 DIAGNOSIS — E119 Type 2 diabetes mellitus without complications: Secondary | ICD-10-CM | POA: Diagnosis not present

## 2021-08-18 DIAGNOSIS — Z79899 Other long term (current) drug therapy: Secondary | ICD-10-CM | POA: Diagnosis not present

## 2021-08-18 DIAGNOSIS — R030 Elevated blood-pressure reading, without diagnosis of hypertension: Secondary | ICD-10-CM | POA: Diagnosis not present

## 2021-08-18 DIAGNOSIS — Z6838 Body mass index (BMI) 38.0-38.9, adult: Secondary | ICD-10-CM | POA: Diagnosis not present

## 2021-08-18 DIAGNOSIS — G629 Polyneuropathy, unspecified: Secondary | ICD-10-CM | POA: Diagnosis not present

## 2021-08-18 DIAGNOSIS — F1120 Opioid dependence, uncomplicated: Secondary | ICD-10-CM | POA: Diagnosis not present

## 2021-09-01 ENCOUNTER — Ambulatory Visit (INDEPENDENT_AMBULATORY_CARE_PROVIDER_SITE_OTHER): Payer: Medicare HMO | Admitting: Internal Medicine

## 2021-09-01 ENCOUNTER — Encounter: Payer: Self-pay | Admitting: Internal Medicine

## 2021-09-01 VITALS — BP 122/80 | HR 94 | Temp 98.5°F | Ht 70.0 in | Wt 268.0 lb

## 2021-09-01 DIAGNOSIS — E1169 Type 2 diabetes mellitus with other specified complication: Secondary | ICD-10-CM

## 2021-09-01 DIAGNOSIS — M79672 Pain in left foot: Secondary | ICD-10-CM

## 2021-09-01 DIAGNOSIS — E785 Hyperlipidemia, unspecified: Secondary | ICD-10-CM | POA: Diagnosis not present

## 2021-09-01 DIAGNOSIS — G894 Chronic pain syndrome: Secondary | ICD-10-CM | POA: Diagnosis not present

## 2021-09-01 DIAGNOSIS — M79671 Pain in right foot: Secondary | ICD-10-CM | POA: Diagnosis not present

## 2021-09-01 LAB — POCT GLYCOSYLATED HEMOGLOBIN (HGB A1C): Hemoglobin A1C: 6.8 % — AB (ref 4.0–5.6)

## 2021-09-01 NOTE — Patient Instructions (Signed)
We will get you in with a new pain clinic. The HgA1c is 6.8 today which is good.

## 2021-09-01 NOTE — Assessment & Plan Note (Signed)
POC Hga1c done today at 6.8 which is at goal. Continue januvia 100 mg daily, jardiance 25 mg daily, glipizide 10 mg daily and acarbose 50 mg TID and metformin 1000 mg BID. Is on statin and ARB. Follow up 3 months.

## 2021-09-01 NOTE — Assessment & Plan Note (Signed)
Needs referral to different pain management. He is on disability due to back and foot pain. Has tried conservative treatments multiple times over the years and is frustrated with his current provider recommending acupuncture which is not covered by insurance.

## 2021-09-01 NOTE — Progress Notes (Signed)
   Subjective:   Patient ID: Douglas Robinson, male    DOB: [birth year], 61 y.o.   MRN: [id number]  HPI The patient is a 61 YO man coming in for follow up.  Review of Systems  Constitutional: Negative.   HENT: Negative.    Eyes: Negative.   Respiratory:  Negative for cough, chest tightness and shortness of breath.   Cardiovascular:  Negative for chest pain, palpitations and leg swelling.  Gastrointestinal:  Negative for abdominal distention, abdominal pain, constipation, diarrhea, nausea and vomiting.  Musculoskeletal:  Positive for arthralgias, back pain, gait problem and myalgias.  Skin: Negative.   Neurological:  Positive for numbness.  Psychiatric/Behavioral: Negative.      Objective:  Physical Exam Constitutional:      Appearance: He is well-developed. He is obese.  HENT:     Head: Normocephalic and atraumatic.  Cardiovascular:     Rate and Rhythm: Normal rate and regular rhythm.  Pulmonary:     Effort: Pulmonary effort is normal. No respiratory distress.     Breath sounds: Normal breath sounds. No wheezing or rales.  Abdominal:     General: Bowel sounds are normal. There is no distension.     Palpations: Abdomen is soft.     Tenderness: There is no abdominal tenderness. There is no rebound.  Musculoskeletal:        General: Tenderness present.     Cervical back: Normal range of motion.  Skin:    General: Skin is warm and dry.  Neurological:     Mental Status: He is alert and oriented to person, place, and time.     Coordination: Coordination normal.     Vitals:   09/01/21 0813  BP: 122/80  Pulse: 94  Temp: 98.5 F (36.9 C)  TempSrc: Oral  SpO2: 98%  Weight: 268 lb (121.6 kg)  Height: '5\' 10"'$  (1.778 m)    Assessment & Plan:

## 2021-09-07 ENCOUNTER — Telehealth: Payer: Self-pay | Admitting: Internal Medicine

## 2021-09-08 ENCOUNTER — Encounter: Payer: Self-pay | Admitting: Internal Medicine

## 2021-09-08 ENCOUNTER — Ambulatory Visit (INDEPENDENT_AMBULATORY_CARE_PROVIDER_SITE_OTHER): Payer: Medicare HMO | Admitting: Internal Medicine

## 2021-09-08 VITALS — BP 118/82 | HR 102 | Temp 98.1°F | Ht 70.0 in | Wt 267.0 lb

## 2021-09-08 DIAGNOSIS — Z9989 Dependence on other enabling machines and devices: Secondary | ICD-10-CM

## 2021-09-08 DIAGNOSIS — G4733 Obstructive sleep apnea (adult) (pediatric): Secondary | ICD-10-CM | POA: Diagnosis not present

## 2021-09-08 DIAGNOSIS — G894 Chronic pain syndrome: Secondary | ICD-10-CM

## 2021-09-08 NOTE — Assessment & Plan Note (Signed)
Ordered new CPAP device auto pap 5-20 today with all supplies.

## 2021-09-08 NOTE — Patient Instructions (Addendum)
We will reorder the CPAP machine and get you in with the pain medicine doctor.

## 2021-09-08 NOTE — Progress Notes (Signed)
   Subjective:   Patient ID: Douglas Robinson, male    DOB: [birth year], 61 y.o.   MRN: [id number]  HPI The patient is a 61 YO man coming in for needing new CPAP. His machine is 50+ years old and cannot get supplies anymore.  Review of Systems  Constitutional: Negative.   HENT: Negative.    Eyes: Negative.   Respiratory:  Negative for cough, chest tightness and shortness of breath.   Cardiovascular:  Negative for chest pain, palpitations and leg swelling.  Gastrointestinal:  Negative for abdominal distention, abdominal pain, constipation, diarrhea, nausea and vomiting.  Musculoskeletal:  Positive for arthralgias, back pain, gait problem and myalgias.  Skin: Negative.   Psychiatric/Behavioral: Negative.      Objective:  Physical Exam Constitutional:      Appearance: He is well-developed. He is obese.  HENT:     Head: Normocephalic and atraumatic.  Cardiovascular:     Rate and Rhythm: Normal rate and regular rhythm.  Pulmonary:     Effort: Pulmonary effort is normal. No respiratory distress.     Breath sounds: Normal breath sounds. No wheezing or rales.  Abdominal:     General: Bowel sounds are normal. There is no distension.     Palpations: Abdomen is soft.     Tenderness: There is no abdominal tenderness. There is no rebound.  Musculoskeletal:        General: Tenderness present.     Cervical back: Normal range of motion.  Skin:    General: Skin is warm and dry.  Neurological:     Mental Status: He is alert and oriented to person, place, and time.     Coordination: Coordination normal.     Vitals:   09/08/21 0754  BP: 118/82  Pulse: (!) 102  Temp: 98.1 F (36.7 C)  TempSrc: Oral  SpO2: 94%  Weight: 267 lb (121.1 kg)  Height: '5\' 10"'$  (1.778 m)    Assessment & Plan:  Visit time 15 minutes in face to face communication with patient and coordination of care, additional 5 minutes spent in record review, coordination or care, ordering tests, communicating/referring to  other healthcare professionals, documenting in medical records all on the same day of the visit for total time 20 minutes spent on the visit.

## 2021-09-08 NOTE — Assessment & Plan Note (Signed)
Referral done last week to pain clinic and will follow up to ensure this is done. Advised that this can take several months to get in with a new clinic and he should maintain current clinic until in with new clinic.He is on disability due to his chronic pain.

## 2021-09-20 DIAGNOSIS — G894 Chronic pain syndrome: Secondary | ICD-10-CM | POA: Diagnosis not present

## 2021-09-20 DIAGNOSIS — R030 Elevated blood-pressure reading, without diagnosis of hypertension: Secondary | ICD-10-CM | POA: Diagnosis not present

## 2021-09-20 DIAGNOSIS — E559 Vitamin D deficiency, unspecified: Secondary | ICD-10-CM | POA: Diagnosis not present

## 2021-09-20 DIAGNOSIS — E119 Type 2 diabetes mellitus without complications: Secondary | ICD-10-CM | POA: Diagnosis not present

## 2021-09-20 DIAGNOSIS — Z6838 Body mass index (BMI) 38.0-38.9, adult: Secondary | ICD-10-CM | POA: Diagnosis not present

## 2021-09-20 DIAGNOSIS — F1120 Opioid dependence, uncomplicated: Secondary | ICD-10-CM | POA: Diagnosis not present

## 2021-09-20 DIAGNOSIS — G629 Polyneuropathy, unspecified: Secondary | ICD-10-CM | POA: Diagnosis not present

## 2021-09-20 DIAGNOSIS — Z79899 Other long term (current) drug therapy: Secondary | ICD-10-CM | POA: Diagnosis not present

## 2021-10-01 ENCOUNTER — Other Ambulatory Visit: Payer: Self-pay | Admitting: Internal Medicine

## 2021-10-16 ENCOUNTER — Other Ambulatory Visit: Payer: Self-pay | Admitting: Internal Medicine

## 2021-10-18 NOTE — Telephone Encounter (Signed)
Please advise 

## 2021-10-19 ENCOUNTER — Other Ambulatory Visit: Payer: Self-pay | Admitting: Internal Medicine

## 2021-10-19 DIAGNOSIS — E559 Vitamin D deficiency, unspecified: Secondary | ICD-10-CM | POA: Diagnosis not present

## 2021-10-19 DIAGNOSIS — Z79899 Other long term (current) drug therapy: Secondary | ICD-10-CM | POA: Diagnosis not present

## 2021-10-19 DIAGNOSIS — Z6838 Body mass index (BMI) 38.0-38.9, adult: Secondary | ICD-10-CM | POA: Diagnosis not present

## 2021-10-19 DIAGNOSIS — G894 Chronic pain syndrome: Secondary | ICD-10-CM | POA: Diagnosis not present

## 2021-10-19 DIAGNOSIS — E119 Type 2 diabetes mellitus without complications: Secondary | ICD-10-CM | POA: Diagnosis not present

## 2021-10-19 DIAGNOSIS — G629 Polyneuropathy, unspecified: Secondary | ICD-10-CM | POA: Diagnosis not present

## 2021-10-19 DIAGNOSIS — F1120 Opioid dependence, uncomplicated: Secondary | ICD-10-CM | POA: Diagnosis not present

## 2021-10-19 DIAGNOSIS — R030 Elevated blood-pressure reading, without diagnosis of hypertension: Secondary | ICD-10-CM | POA: Diagnosis not present

## 2021-11-01 ENCOUNTER — Telehealth: Payer: Self-pay | Admitting: Internal Medicine

## 2021-11-01 NOTE — Telephone Encounter (Signed)
Please advise 

## 2021-11-01 NOTE — Telephone Encounter (Signed)
Patient came into office requesting a referral to [facility] at  [address]. [address], [place], [place] Phone:  [phone] Fax: [phone]  Patient said he was seing a provider for pain manangment Dr Donovan Kail and he doesn't want to see them anymore and he would like to go to [facility] for his issues.  Call back if needed for patient is [phone]

## 2021-11-02 NOTE — Telephone Encounter (Signed)
In general [facility] does not do pain management. This will only address orthopedic issues and address if he needs injection, therapy, surgery, or other treatment to help his chronic pain. Does he still want referral?

## 2021-11-03 NOTE — Telephone Encounter (Signed)
LVM--to call the office back regarding referral.

## 2021-11-03 NOTE — Telephone Encounter (Signed)
Spoke to pt wife--declined regarding referral. Pt wife requesting appt. --11/14/21 w/ Dr. Sharlet Salina.

## 2021-11-07 DIAGNOSIS — H524 Presbyopia: Secondary | ICD-10-CM | POA: Diagnosis not present

## 2021-11-07 DIAGNOSIS — H25013 Cortical age-related cataract, bilateral: Secondary | ICD-10-CM | POA: Diagnosis not present

## 2021-11-07 DIAGNOSIS — H2513 Age-related nuclear cataract, bilateral: Secondary | ICD-10-CM | POA: Diagnosis not present

## 2021-11-07 DIAGNOSIS — E119 Type 2 diabetes mellitus without complications: Secondary | ICD-10-CM | POA: Diagnosis not present

## 2021-11-14 ENCOUNTER — Ambulatory Visit (INDEPENDENT_AMBULATORY_CARE_PROVIDER_SITE_OTHER): Payer: Medicare HMO | Admitting: Internal Medicine

## 2021-11-14 ENCOUNTER — Encounter: Payer: Self-pay | Admitting: Internal Medicine

## 2021-11-14 VITALS — BP 128/84 | HR 102 | Temp 97.9°F | Ht 70.0 in | Wt 270.0 lb

## 2021-11-14 DIAGNOSIS — M79672 Pain in left foot: Secondary | ICD-10-CM

## 2021-11-14 DIAGNOSIS — M79671 Pain in right foot: Secondary | ICD-10-CM

## 2021-11-14 DIAGNOSIS — G894 Chronic pain syndrome: Secondary | ICD-10-CM

## 2021-11-14 MED ORDER — TIZANIDINE HCL 4 MG PO TABS: 4.0000 mg | ORAL_TABLET | Freq: Four times a day (QID) | ORAL | 3 refills | Status: DC | PRN

## 2021-11-14 NOTE — Assessment & Plan Note (Signed)
Currently using hydrocodone 10/325 #90 per month and due to lack of availability he was given substitute 7.5/325 #90. His current provider was not willing to adjust the number to match his current MME and he has suffered due to uncontrolled pain due to 25% dose reduction. Referral to pain management as he wishes to switch. Rx tizanidine 4 mg TID prn to help in the meantime.

## 2021-11-14 NOTE — Assessment & Plan Note (Signed)
He is on disability for some time due to the chronic pain in the feet. He is using hydrocodone 10/325 #90 per month. Due to lack of availability he was given 7.5/325 recently #90 per month and this represents 25% dose reduction. His current provider was unwilling to adjust the number of pills to match his current MME. Rx tizanidine to help with pain 4 mg TID prn in the meantime and referral to pain management.

## 2021-11-14 NOTE — Progress Notes (Signed)
   Subjective:   Patient ID: Douglas Robinson, male    DOB: 1960-07-27, 61 y.o.   MRN: [id number]  HPI The patient is a 61 YO man coming in for referral to pain management. His current pain management provider is not satisfactory.   Review of Systems  Constitutional: Negative.   HENT: Negative.    Eyes: Negative.   Respiratory:  Negative for cough, chest tightness and shortness of breath.   Cardiovascular:  Negative for chest pain, palpitations and leg swelling.  Gastrointestinal:  Negative for abdominal distention, abdominal pain, constipation, diarrhea, nausea and vomiting.  Musculoskeletal:  Positive for arthralgias, gait problem and myalgias.  Skin: Negative.   Psychiatric/Behavioral: Negative.      Objective:  Physical Exam Constitutional:      Appearance: He is well-developed. He is obese.  HENT:     Head: Normocephalic and atraumatic.  Cardiovascular:     Rate and Rhythm: Normal rate and regular rhythm.  Pulmonary:     Effort: Pulmonary effort is normal. No respiratory distress.     Breath sounds: Normal breath sounds. No wheezing or rales.  Abdominal:     General: Bowel sounds are normal. There is no distension.     Palpations: Abdomen is soft.     Tenderness: There is no abdominal tenderness. There is no rebound.  Musculoskeletal:        General: Tenderness present.     Cervical back: Normal range of motion.  Skin:    General: Skin is warm and dry.  Neurological:     Mental Status: He is alert and oriented to person, place, and time.     Coordination: Coordination abnormal.     Vitals:   11/14/21 0915  BP: 128/84  Pulse: (!) 102  Temp: 97.9 F (36.6 C)  TempSrc: Oral  SpO2: 98%  Weight: 270 lb (122.5 kg)  Height: '5\' 10"'$  (1.778 m)    Assessment & Plan:

## 2021-11-14 NOTE — Patient Instructions (Addendum)
Call [facility] to schedule a visit. [phone]   We have sent in tizanidine which is a muscle relaxer to take up to 3 times a day for the pain.

## 2021-11-21 DIAGNOSIS — F1120 Opioid dependence, uncomplicated: Secondary | ICD-10-CM | POA: Diagnosis not present

## 2021-11-21 DIAGNOSIS — E559 Vitamin D deficiency, unspecified: Secondary | ICD-10-CM | POA: Diagnosis not present

## 2021-11-21 DIAGNOSIS — Z79899 Other long term (current) drug therapy: Secondary | ICD-10-CM | POA: Diagnosis not present

## 2021-11-21 DIAGNOSIS — Z6837 Body mass index (BMI) 37.0-37.9, adult: Secondary | ICD-10-CM | POA: Diagnosis not present

## 2021-11-21 DIAGNOSIS — G629 Polyneuropathy, unspecified: Secondary | ICD-10-CM | POA: Diagnosis not present

## 2021-11-21 DIAGNOSIS — E119 Type 2 diabetes mellitus without complications: Secondary | ICD-10-CM | POA: Diagnosis not present

## 2021-11-21 DIAGNOSIS — R030 Elevated blood-pressure reading, without diagnosis of hypertension: Secondary | ICD-10-CM | POA: Diagnosis not present

## 2021-11-21 DIAGNOSIS — G894 Chronic pain syndrome: Secondary | ICD-10-CM | POA: Diagnosis not present

## 2021-11-21 NOTE — Progress Notes (Unsigned)
Subjective:   Douglas Robinson is a 61 y.o. male who presents for Medicare Annual/Subsequent preventive examination. I connected with  Tamala Fothergill on 11/22/21 by a audio enabled telemedicine application and verified that I am speaking with the correct person using two identifiers.  Patient Location: Home  Provider Location: Home Office  I discussed the limitations of evaluation and management by telemedicine. The patient expressed understanding and agreed to proceed.  Review of Systems    Deferred to PCP Cardiac Risk Factors include: advanced age (>25mn, >>60women);diabetes mellitus;dyslipidemia;male gender;hypertension;obesity (BMI >30kg/m2)     Objective:    Today's Vitals   11/22/21 0838  PainSc: 7    There is no height or weight on file to calculate BMI.     11/22/2021    8:52 AM 11/21/2020   11:18 AM 08/12/2019    9:31 AM 08/06/2018    9:20 AM 12/27/2014    7:08 AM 12/14/2014    9:19 AM 07/29/2013    8:25 AM  Advanced Directives  Does Patient Have a Medical Advance Directive? No No No No No No Patient would like information  Would patient like information on creating a medical advance directive? No - Patient declined No - Patient declined Yes (MAU/Ambulatory/Procedural Areas - Information given) Yes (ED - Information included in AVS)   Advance directive brochure given (Outpatient ONLY)    Current Medications (verified) Outpatient Encounter Medications as of 11/22/2021  Medication Sig   acarbose (PRECOSE) 50 MG tablet TAKE 1 TABLET THREE TIMES DAILY WITH MEALS   ACCU-CHEK AVIVA PLUS test strip TEST BLOOD SUGAR TWICE DAILY   Accu-Chek Softclix Lancets lancets Use to check glucose as needed E11.9   albuterol (VENTOLIN HFA) 108 (90 Base) MCG/ACT inhaler INHALE 1 TO 2 PUFFS EVERY 6 HOURS AS NEEDED FOR WHEEZING  OR FOR SHORTNESS OF BREATH   aspirin 81 MG tablet Take 81 mg by mouth daily.   glipiZIDE (GLUCOTROL XL) 10 MG 24 hr tablet TAKE 1 TABLET EVERY DAY  WITH   BREAKFAST   hydrochlorothiazide (HYDRODIURIL) 25 MG tablet TAKE 1 TABLET EVERY DAY   HYDROcodone-acetaminophen (NORCO) 10-325 MG per tablet Take 1 tablet by mouth every 6 (six) hours as needed.   irbesartan (AVAPRO) 300 MG tablet TAKE 1 TABLET EVERY DAY   JANUVIA 100 MG tablet TAKE 1 TABLET EVERY DAY   JARDIANCE 25 MG TABS tablet TAKE 1 TABLET EVERY DAY   metFORMIN (GLUCOPHAGE) 1000 MG tablet TAKE 1 TABLET TWICE DAILY WITH MEALS   mirtazapine (REMERON) 45 MG tablet TAKE 1 TABLET AT BEDTIME. DISCONTINUE ANY OTHER DOSES OF REMERON.   Multiple Vitamin (MULTIVITAMIN) tablet Take 1 tablet by mouth daily.   ofloxacin (OCUFLOX) 0.3 % ophthalmic solution    pantoprazole (PROTONIX) 20 MG tablet TAKE 1 TABLET EVERY DAY   pravastatin (PRAVACHOL) 10 MG tablet TAKE 1 TABLET EVERY DAY   PRODIGY TWIST TOP LANCETS 28G MISC Use one lancet at each glucose check, as needed.   tiZANidine (ZANAFLEX) 4 MG tablet Take 1 tablet (4 mg total) by mouth every 6 (six) hours as needed for muscle spasms.   traZODone (DESYREL) 100 MG tablet Take 1 tablet by mouth at bedtime.   vitamin B-12 (CYANOCOBALAMIN) 1000 MCG tablet Take 1,000 mcg by mouth daily.   No facility-administered encounter medications on file as of 11/22/2021.    Allergies (verified) Naproxen   History: Past Medical History:  Diagnosis Date   Anxiety    Cataract    COPD (chronic obstructive pulmonary  disease) ([place])    Diabetes ([place])    GERD (gastroesophageal reflux disease)    HTN (hypertension)    Hyperlipidemia    OSA on CPAP    Sleep apnea    Past Surgical History:  Procedure Laterality Date   CARPAL TUNNEL RELEASE Right    COLONOSCOPY  2013   HERNIA REPAIR     POLYPECTOMY  2013   4 polyps-TA   REPAIR ANKLE LIGAMENT  1995   "Sometime before 1996"   Family History  Problem Relation Age of Onset   Cancer - Prostate Father    Colon polyps Father    Colon cancer Neg Hx    Rectal cancer Neg Hx    Stomach cancer Neg Hx    Esophageal  cancer Neg Hx    Social History   Socioeconomic History   Marital status: Married    Spouse name: Butch Penny   Number of children: 0   Years of education: 12th   Highest education level: Not on file  Occupational History   Occupation: disabilty  Tobacco Use   Smoking status: Never    Passive exposure: Never   Smokeless tobacco: Never  Vaping Use   Vaping Use: Never used  Substance and Sexual Activity   Alcohol use: No    Alcohol/week: 0.0 standard drinks of alcohol   Drug use: No   Sexual activity: Yes  Other Topics Concern   Not on file  Social History Narrative   Not on file   Social Determinants of Health   Financial Resource Strain: Medium Risk (11/22/2021)   Overall Financial Resource Strain (CARDIA)    Difficulty of Paying Living Expenses: Somewhat hard  Food Insecurity: Unknown (11/22/2021)   Hunger Vital Sign    Worried About Running Out of Food in the Last Year: Not on file    Ran Out of Food in the Last Year: Never true  Transportation Needs: No Transportation Needs (11/22/2021)   PRAPARE - Hydrologist (Medical): No    Lack of Transportation (Non-Medical): No  Physical Activity: Insufficiently Active (11/22/2021)   Exercise Vital Sign    Days of Exercise per Week: 3 days    Minutes of Exercise per Session: 30 min  Stress: No Stress Concern Present (11/22/2021)   [place]    Feeling of Stress : Only a little  Social Connections: Moderately Isolated (11/22/2021)   Social Connection and Isolation Panel [NHANES]    Frequency of Communication with Friends and Family: Twice a week    Frequency of Social Gatherings with Friends and Family: Twice a week    Attends Religious Services: Never    Marine scientist or Organizations: No    Attends Music therapist: Never    Marital Status: Married    Tobacco Counseling Counseling given: Not  Answered   Clinical Intake:  Pre-visit preparation completed: Yes  Pain : 0-10 Pain Score: 7  Pain Type: Chronic pain Pain Location: Foot Pain Orientation: Right Pain Descriptors / Indicators: Aching, Discomfort, Sharp Pain Relieving Factors: medication  Pain Relieving Factors: medication  Nutritional Status: BMI > 30  Obese Nutritional Risks: None Diabetes: Yes CBG done?: No (phone visit) Did pt. bring in CBG monitor from home?: No (phone visit)  How often do you need to have someone help you when you read instructions, pamphlets, or other written materials from your doctor or pharmacy?: 1 - Never What is the last  grade level you completed in school?: 12th  Diabetic?Yes Nutrition Risk Assessment:  Has the patient had any N/V/D within the last 2 months?  Yes  Does the patient have any non-healing wounds?  No  Has the patient had any unintentional weight loss or weight gain?  No   Diabetes:  Is the patient diabetic?  Yes  If diabetic, was a CBG obtained today?  No  Did the patient bring in their glucometer from home?  No  How often do you monitor your CBG's? Reports occassionally.   Financial Strains and Diabetes Management:  Are you having any financial strains with the device, your supplies or your medication? No .  Does the patient want to be seen by Chronic Care Management for management of their diabetes?  No  Would the patient like to be referred to a Nutritionist or for Diabetic Management?  No   Diabetic Exams:  Diabetic Eye Exam: Completed 11/07/21 Diabetic Foot Exam: Completed 04/10/21   Interpreter Needed?: No  Information entered by :: Emelia Loron RN   Activities of Daily Living    11/22/2021    8:50 AM  In your present state of health, do you have any difficulty performing the following activities:  Hearing? 0  Vision? 0  Difficulty concentrating or making decisions? 0  Walking or climbing stairs? 0  Dressing or bathing? 0  Doing errands,  shopping? 0  Preparing Food and eating ? N  Using the Toilet? N  In the past six months, have you accidently leaked urine? N  Do you have problems with loss of bowel control? N  Managing your Medications? N  Managing your Finances? N  Housekeeping or managing your Housekeeping? N    Patient Care Team: Hoyt Koch, MD as PCP - General (Internal Medicine) Milus Banister, MD as Attending Physician (Gastroenterology) Rutherford Guys, MD as Consulting Physician (Ophthalmology) Jeanella Anton, NP as Nurse Practitioner (Nurse Practitioner) Tomasa Blase, [facility] (Inactive) as Pharmacist (Pharmacist)  Indicate any recent Medical Services you may have received from other than [facility] providers in the past year (date may be approximate).     Assessment:   This is a routine wellness examination for Douglas Robinson.  Hearing/Vision screen No results found.  Dietary issues and exercise activities discussed: Current Exercise Habits: Home exercise routine;Structured exercise class, Type of exercise: strength training/weights, Time (Minutes): 30, Frequency (Times/Week): 3, Weekly Exercise (Minutes/Week): 90, Intensity: Mild, Exercise limited by: orthopedic condition(s)   Goals Addressed             This Visit's Progress    Patient Stated       Maintain my current health status      Depression Screen    11/22/2021    9:33 AM 11/21/2020   11:20 AM 11/21/2020   11:16 AM 05/06/2020    8:19 AM 08/12/2019    9:26 AM 08/06/2018    9:17 AM 09/30/2017    2:43 PM  PHQ 2/9 Scores  PHQ - 2 Score 2 0 0 0 0 1 3  PHQ- 9 Score '5     3 12    '$ Fall Risk    11/22/2021    8:54 AM 09/08/2021    8:09 AM 11/21/2020   11:19 AM 05/06/2020    8:18 AM 08/12/2019    9:33 AM  Fall Risk   Falls in the past year? 0 0 0 0 0  Number falls in past yr: 0 0 0 0 0  Injury with Fall?  0 0 0 0 0  Risk for fall due to : No Fall Risks No Fall Risks   No Fall Risks  Follow up Falls evaluation completed Falls evaluation  completed Falls evaluation completed  Falls evaluation completed    [place]:  Any stairs in or around the home? Yes  If so, are there any without handrails? Yes  Home free of loose throw rugs in walkways, pet beds, electrical cords, etc? Yes  Adequate lighting in your home to reduce risk of falls? Yes   ASSISTIVE DEVICES UTILIZED TO PREVENT FALLS:  Life alert? No  Use of a cane, walker or w/c? No  Grab bars in the bathroom? No  Shower chair or bench in shower? No  Elevated toilet seat or a handicapped toilet? No   Cognitive Function:        11/22/2021    9:00 AM  6CIT Screen  What Year? 0 points  What month? 0 points  What time? 0 points  Count back from 20 0 points  Months in reverse 2 points  Repeat phrase 2 points  Total Score 4 points    Immunizations Immunization History  Administered Date(s) Administered   Fluad Quad(high Dose 65+) 10/10/2018   Influenza,inj,Quad PF,6+ Mos 09/25/2017, 09/23/2018   Influenza-Unspecified 09/23/2017, 11/16/2019, 11/01/2020   Moderna Sars-Covid-2 Vaccination 05/19/2019, 06/16/2019, 01/25/2020   PFIZER SARS-COV-2 Pediatric Vaccination 5-44yr 07/28/2020   Pfizer Covid-19 Vaccine Bivalent Booster 13yr& up 11/02/2020   Pneumococcal Polysaccharide-23 09/13/2011   Tdap 11/06/2017    TDAP status: Up to date  Flu Vaccine status: Due, Education has been provided regarding the importance of this vaccine. Advised may receive this vaccine at local pharmacy or Health Dept. Aware to provide a copy of the vaccination record if obtained from local pharmacy or Health Dept. Verbalized acceptance and understanding.  Covid-19 vaccine status: Information provided on how to obtain vaccines.   Qualifies for Shingles Vaccine? Yes   Zostavax completed No   Shingrix Completed?: No.    Education has been provided regarding the importance of this vaccine. Patient has been advised to call insurance company to  determine out of pocket expense if they have not yet received this vaccine. Advised may also receive vaccine at local pharmacy or Health Dept. Verbalized acceptance and understanding.  Screening Tests Health Maintenance  Topic Date Due   HIV Screening  Never done   Zoster Vaccines- Shingrix (1 of 2) Never done   OPHTHALMOLOGY EXAM  10/27/2019   COVID-19 Vaccine (5 - Moderna risk series) 12/28/2020   INFLUENZA VACCINE  04/15/2022 (Originally 08/15/2021)   HEMOGLOBIN A1C  03/04/2022   Diabetic kidney evaluation - GFR measurement  04/11/2022   FOOT EXAM  04/11/2022   Diabetic kidney evaluation - Urine ACR  07/21/2022   Medicare Annual Wellness (AWV)  11/23/2022   COLONOSCOPY (Pts 45-4932yrnsurance coverage will need to be confirmed)  08/03/2027   TETANUS/TDAP  11/07/2027   Hepatitis C Screening  Completed   HPV VACCINES  Aged Out    Health Maintenance  Health Maintenance Due  Topic Date Due   HIV Screening  Never done   Zoster Vaccines- Shingrix (1 of 2) Never done   OPHTHALMOLOGY EXAM  10/27/2019   COVID-19 Vaccine (5 - Moderna risk series) 12/28/2020    Colorectal cancer screening: Type of screening: Colonoscopy. Completed 08/02/20. Repeat every 7 years  Lung Cancer Screening: (Low Dose CT Chest recommended if Age 71-83-80ars, 30 pack-year currently smoking  OR have quit w/in 15years.) does not qualify.   Additional Screening:  Hepatitis C Screening: does qualify; Completed 08/29/17  Vision Screening: Recommended annual ophthalmology exams for early detection of glaucoma and other disorders of the eye. Is the patient up to date with their annual eye exam?  Yes  Who is the provider or what is the name of the office in which the patient attends annual eye exams? Dr. Gershon Crane If pt is not established with a provider, would they like to be referred to a provider to establish care?  N/A .   Dental Screening: Recommended annual dental exams for proper oral hygiene  Community  Resource Referral / Chronic Care Management: CRR required this visit?  No   CCM required this visit?  No      Plan:     I have personally reviewed and noted the following in the patient's chart:   Medical and social history Use of alcohol, tobacco or illicit drugs  Current medications and supplements including opioid prescriptions. Patient is not currently taking opioid prescriptions. Functional ability and status Nutritional status Physical activity Advanced directives List of other physicians Hospitalizations, surgeries, and ER visits in previous 12 months Vitals Screenings to include cognitive, depression, and falls Referrals and appointments  In addition, I have reviewed and discussed with patient certain preventive protocols, quality metrics, and best practice recommendations. A written personalized care plan for preventive services as well as general preventive health recommendations were provided to patient.     Michiel Cowboy, RN   11/22/2021   Nurse Notes:  Douglas Robinson , Thank you for taking time to come for your Medicare Wellness Visit. I appreciate your ongoing commitment to your health goals. Please review the following plan we discussed and let me know if I can assist you in the future.   These are the goals we discussed:  Goals       Patient Stated     Patient Stated (pt-stated)      I'm ready to go back to the gym       Other     Patient Stated      Maintain my current health status        This is a list of the screening recommended for you and due dates:  Health Maintenance  Topic Date Due   HIV Screening  Never done   Zoster (Shingles) Vaccine (1 of 2) Never done   Eye exam for diabetics  10/27/2019   COVID-19 Vaccine (5 - Moderna risk series) 12/28/2020   Flu Shot  04/15/2022*   Hemoglobin A1C  03/04/2022   Yearly kidney function blood test for diabetes  04/11/2022   Complete foot exam   04/11/2022   Yearly kidney health urinalysis for  diabetes  07/21/2022   Medicare Annual Wellness Visit  11/23/2022   Colon Cancer Screening  08/03/2027   Tetanus Vaccine  11/07/2027   Hepatitis C Screening: USPSTF Recommendation to screen - Ages 18-79 yo.  Completed   HPV Vaccine  Aged Out  *Topic was postponed. The date shown is not the original due date.

## 2021-11-21 NOTE — Patient Instructions (Signed)
Health Maintenance, Male Adopting a healthy lifestyle and getting preventive care are important in promoting health and wellness. Ask your health care provider about: The right schedule for you to have regular tests and exams. Things you can do on your own to prevent diseases and keep yourself healthy. What should I know about diet, weight, and exercise? Eat a healthy diet  Eat a diet that includes plenty of vegetables, fruits, low-fat dairy products, and lean protein. Do not eat a lot of foods that are high in solid fats, added sugars, or sodium. Maintain a healthy weight Body mass index (BMI) is a measurement that can be used to identify possible weight problems. It estimates body fat based on height and weight. Your health care provider can help determine your BMI and help you achieve or maintain a healthy weight. Get regular exercise Get regular exercise. This is one of the most important things you can do for your health. Most adults should: Exercise for at least 150 minutes each week. The exercise should increase your heart rate and make you sweat (moderate-intensity exercise). Do strengthening exercises at least twice a week. This is in addition to the moderate-intensity exercise. Spend less time sitting. Even light physical activity can be beneficial. Watch cholesterol and blood lipids Have your blood tested for lipids and cholesterol at 63 years of age, then have this test every 5 years. You may need to have your cholesterol levels checked more often if: Your lipid or cholesterol levels are high. You are older than 63 years of age. You are at high risk for heart disease. What should I know about cancer screening? Many types of cancers can be detected early and may often be prevented. Depending on your health history and family history, you may need to have cancer screening at various ages. This may include screening for: Colorectal cancer. Prostate cancer. Skin cancer. Lung  cancer. What should I know about heart disease, diabetes, and high blood pressure? Blood pressure and heart disease High blood pressure causes heart disease and increases the risk of stroke. This is more likely to develop in people who have high blood pressure readings or are overweight. Talk with your health care provider about your target blood pressure readings. Have your blood pressure checked: Every 3-5 years if you are 18-39 years of age. Every year if you are 40 years old or older. If you are between the ages of 65 and 75 and are a current or former smoker, ask your health care provider if you should have a one-time screening for abdominal aortic aneurysm (AAA). Diabetes Have regular diabetes screenings. This checks your fasting blood sugar level. Have the screening done: Once every three years after age 45 if you are at a normal weight and have a low risk for diabetes. More often and at a younger age if you are overweight or have a high risk for diabetes. What should I know about preventing infection? Hepatitis B If you have a higher risk for hepatitis B, you should be screened for this virus. Talk with your health care provider to find out if you are at risk for hepatitis B infection. Hepatitis C Blood testing is recommended for: Everyone born from 1945 through 1965. Anyone with known risk factors for hepatitis C. Sexually transmitted infections (STIs) You should be screened each year for STIs, including gonorrhea and chlamydia, if: You are sexually active and are younger than 63 years of age. You are older than 63 years of age and your   health care provider tells you that you are at risk for this type of infection. Your sexual activity has changed since you were last screened, and you are at increased risk for chlamydia or gonorrhea. Ask your health care provider if you are at risk. Ask your health care provider about whether you are at high risk for HIV. Your health care provider  may recommend a prescription medicine to help prevent HIV infection. If you choose to take medicine to prevent HIV, you should first get tested for HIV. You should then be tested every 3 months for as long as you are taking the medicine. Follow these instructions at home: Alcohol use Do not drink alcohol if your health care provider tells you not to drink. If you drink alcohol: Limit how much you have to 0-2 drinks a day. Know how much alcohol is in your drink. In the [place] one drink equals one 12 oz bottle of beer (355 mL), one 5 oz glass of Leita Lindbloom (148 mL), or one 1 oz glass of hard liquor (44 mL). Lifestyle Do not use any products that contain nicotine or tobacco. These products include cigarettes, chewing tobacco, and vaping devices, such as e-cigarettes. If you need help quitting, ask your health care provider. Do not use street drugs. Do not share needles. Ask your health care provider for help if you need support or information about quitting drugs. General instructions Schedule regular health, dental, and eye exams. Stay current with your vaccines. Tell your health care provider if: You often feel depressed. You have ever been abused or do not feel safe at home. Summary Adopting a healthy lifestyle and getting preventive care are important in promoting health and wellness. Follow your health care provider's instructions about healthy diet, exercising, and getting tested or screened for diseases. Follow your health care provider's instructions on monitoring your cholesterol and blood pressure. This information is not intended to replace advice given to you by your health care provider. Make sure you discuss any questions you have with your health care provider. Document Revised: 05/23/2020 Document Reviewed: 05/23/2020 Elsevier Patient Education  2023 Elsevier Inc.  

## 2021-11-22 ENCOUNTER — Ambulatory Visit (INDEPENDENT_AMBULATORY_CARE_PROVIDER_SITE_OTHER): Payer: Medicare HMO | Admitting: *Deleted

## 2021-11-22 DIAGNOSIS — Z0000 Encounter for general adult medical examination without abnormal findings: Secondary | ICD-10-CM

## 2021-12-05 ENCOUNTER — Encounter: Payer: Self-pay | Admitting: Internal Medicine

## 2021-12-05 ENCOUNTER — Ambulatory Visit (INDEPENDENT_AMBULATORY_CARE_PROVIDER_SITE_OTHER): Payer: Medicare HMO | Admitting: Internal Medicine

## 2021-12-05 VITALS — BP 120/80 | HR 101 | Temp 98.3°F | Wt 260.0 lb

## 2021-12-05 DIAGNOSIS — E1169 Type 2 diabetes mellitus with other specified complication: Secondary | ICD-10-CM | POA: Diagnosis not present

## 2021-12-05 DIAGNOSIS — G4733 Obstructive sleep apnea (adult) (pediatric): Secondary | ICD-10-CM

## 2021-12-05 DIAGNOSIS — E785 Hyperlipidemia, unspecified: Secondary | ICD-10-CM

## 2021-12-05 DIAGNOSIS — G894 Chronic pain syndrome: Secondary | ICD-10-CM

## 2021-12-05 LAB — POCT GLYCOSYLATED HEMOGLOBIN (HGB A1C): HbA1c POC (<> result, manual entry): 6.5 % (ref 4.0–5.6)

## 2021-12-05 MED ORDER — ACARBOSE 50 MG PO TABS: 50.0000 mg | ORAL_TABLET | Freq: Three times a day (TID) | ORAL | 3 refills | Status: DC

## 2021-12-05 MED ORDER — IRBESARTAN 300 MG PO TABS: 300.0000 mg | ORAL_TABLET | Freq: Every day | ORAL | 3 refills | Status: DC

## 2021-12-05 MED ORDER — METFORMIN HCL 1000 MG PO TABS: 1000.0000 mg | ORAL_TABLET | Freq: Two times a day (BID) | ORAL | 2 refills | Status: DC

## 2021-12-05 MED ORDER — EMPAGLIFLOZIN 25 MG PO TABS: 25.0000 mg | ORAL_TABLET | Freq: Every day | ORAL | 3 refills | Status: DC

## 2021-12-05 MED ORDER — PRAVASTATIN SODIUM 10 MG PO TABS: 10.0000 mg | ORAL_TABLET | Freq: Every day | ORAL | 3 refills | Status: DC

## 2021-12-05 MED ORDER — GLIPIZIDE ER 10 MG PO TB24: 10.0000 mg | ORAL_TABLET | Freq: Every day | ORAL | 3 refills | Status: DC

## 2021-12-05 MED ORDER — PANTOPRAZOLE SODIUM 20 MG PO TBEC
20.0000 mg | DELAYED_RELEASE_TABLET | Freq: Every day | ORAL | 3 refills | Status: DC
Start: 2021-12-05 — End: 2022-05-21

## 2021-12-05 NOTE — Assessment & Plan Note (Signed)
POC HgA1c done and stable at 6.5. He denies low sugars. Given holidays upcoming we will defer adjustment of regimen. If still doing well we can consider that at upcoming apt. Continue acarbose 50 mg TID and jardiance 25 mg daily and januvia 100 mg daily and glipizide 10 mg daily and metformin 1000 mg BID.Marland Kitchen

## 2021-12-05 NOTE — Patient Instructions (Addendum)
For the pain management this was sent to [facility]: Address: [id number], [place], [place] [address] Phone: [phone]  We will check on the CPAP if you do not hear in 2-3 weeks let us know.

## 2021-12-05 NOTE — Progress Notes (Signed)
   Subjective:   Patient ID: Douglas Robinson, male    DOB: [birth year], 61 y.o.   MRN: [id number]  HPI The patient is a 61 YO man coming in for follow up. HgA1c 6.5. Never got CPAP. Still has not heard from pain management.   Review of Systems  Constitutional:  Positive for activity change.  HENT: Negative.    Eyes: Negative.   Respiratory:  Negative for cough, chest tightness and shortness of breath.   Cardiovascular:  Negative for chest pain, palpitations and leg swelling.  Gastrointestinal:  Negative for abdominal distention, abdominal pain, constipation, diarrhea, nausea and vomiting.  Musculoskeletal:  Positive for arthralgias, joint swelling and myalgias.  Skin: Negative.   Neurological: Negative.   Psychiatric/Behavioral: Negative.      Objective:  Physical Exam Constitutional:      Appearance: He is well-developed.  HENT:     Head: Normocephalic and atraumatic.  Cardiovascular:     Rate and Rhythm: Normal rate and regular rhythm.  Pulmonary:     Effort: Pulmonary effort is normal. No respiratory distress.     Breath sounds: Normal breath sounds. No wheezing or rales.  Abdominal:     General: Bowel sounds are normal. There is no distension.     Palpations: Abdomen is soft.     Tenderness: There is no abdominal tenderness. There is no rebound.  Musculoskeletal:        General: Tenderness present.     Cervical back: Normal range of motion.  Skin:    General: Skin is warm and dry.  Neurological:     Mental Status: He is alert and oriented to person, place, and time.     Coordination: Coordination normal.     Vitals:   12/05/21 0808  BP: 120/80  Pulse: (!) 101  Temp: 98.3 F (36.8 C)  TempSrc: Oral  SpO2: 95%  Weight: 260 lb (117.9 kg)    Assessment & Plan:

## 2021-12-05 NOTE — Assessment & Plan Note (Signed)
Will check on status of CPAP. Ordered in August.

## 2021-12-05 NOTE — Assessment & Plan Note (Signed)
Given patient and wife information on office he was referred to for pain management.

## 2021-12-12 ENCOUNTER — Other Ambulatory Visit: Payer: Self-pay | Admitting: Internal Medicine

## 2021-12-13 ENCOUNTER — Other Ambulatory Visit: Payer: Self-pay | Admitting: Internal Medicine

## 2021-12-20 DIAGNOSIS — Z79899 Other long term (current) drug therapy: Secondary | ICD-10-CM | POA: Diagnosis not present

## 2021-12-20 DIAGNOSIS — G894 Chronic pain syndrome: Secondary | ICD-10-CM | POA: Diagnosis not present

## 2021-12-20 DIAGNOSIS — Z6838 Body mass index (BMI) 38.0-38.9, adult: Secondary | ICD-10-CM | POA: Diagnosis not present

## 2021-12-20 DIAGNOSIS — R030 Elevated blood-pressure reading, without diagnosis of hypertension: Secondary | ICD-10-CM | POA: Diagnosis not present

## 2021-12-20 DIAGNOSIS — E559 Vitamin D deficiency, unspecified: Secondary | ICD-10-CM | POA: Diagnosis not present

## 2021-12-20 DIAGNOSIS — G629 Polyneuropathy, unspecified: Secondary | ICD-10-CM | POA: Diagnosis not present

## 2021-12-20 DIAGNOSIS — E119 Type 2 diabetes mellitus without complications: Secondary | ICD-10-CM | POA: Diagnosis not present

## 2021-12-20 DIAGNOSIS — F1120 Opioid dependence, uncomplicated: Secondary | ICD-10-CM | POA: Diagnosis not present

## 2021-12-25 ENCOUNTER — Telehealth: Payer: Self-pay | Admitting: Internal Medicine

## 2021-12-25 NOTE — Telephone Encounter (Signed)
Messaged mary for referrals to have resent for patient

## 2021-12-25 NOTE — Telephone Encounter (Signed)
Patient stopped by this morning to let us know that they were at [facility] this morning and they were told that they need a new referral from Dr. Sharlet Salina. Patient's call back number is [phone]

## 2021-12-29 ENCOUNTER — Other Ambulatory Visit: Payer: Self-pay | Admitting: Internal Medicine

## 2022-01-05 ENCOUNTER — Telehealth: Payer: Self-pay | Admitting: Internal Medicine

## 2022-01-05 NOTE — Telephone Encounter (Signed)
Patient called and stated they got their C-Pap machine and it is at a 5 and they need it at 71, states he can't breathe. He needs a prescription sent in so they can send the C-Pap to get changed. A good callback number is [phone].

## 2022-01-12 NOTE — Telephone Encounter (Signed)
Patients wife called to let us know that her husband returned the [place] equipment yesterday, they learned there was like a $100 co-pay that they could not afford.

## 2022-01-12 NOTE — Telephone Encounter (Signed)
Noted  

## 2022-01-19 DIAGNOSIS — Z6838 Body mass index (BMI) 38.0-38.9, adult: Secondary | ICD-10-CM | POA: Diagnosis not present

## 2022-01-19 DIAGNOSIS — G629 Polyneuropathy, unspecified: Secondary | ICD-10-CM | POA: Diagnosis not present

## 2022-01-19 DIAGNOSIS — E559 Vitamin D deficiency, unspecified: Secondary | ICD-10-CM | POA: Diagnosis not present

## 2022-01-19 DIAGNOSIS — R030 Elevated blood-pressure reading, without diagnosis of hypertension: Secondary | ICD-10-CM | POA: Diagnosis not present

## 2022-01-19 DIAGNOSIS — Z79899 Other long term (current) drug therapy: Secondary | ICD-10-CM | POA: Diagnosis not present

## 2022-01-19 DIAGNOSIS — F1120 Opioid dependence, uncomplicated: Secondary | ICD-10-CM | POA: Diagnosis not present

## 2022-01-19 DIAGNOSIS — E119 Type 2 diabetes mellitus without complications: Secondary | ICD-10-CM | POA: Diagnosis not present

## 2022-01-19 DIAGNOSIS — G894 Chronic pain syndrome: Secondary | ICD-10-CM | POA: Diagnosis not present

## 2022-02-20 DIAGNOSIS — Z79899 Other long term (current) drug therapy: Secondary | ICD-10-CM | POA: Diagnosis not present

## 2022-02-20 DIAGNOSIS — Z6838 Body mass index (BMI) 38.0-38.9, adult: Secondary | ICD-10-CM | POA: Diagnosis not present

## 2022-02-20 DIAGNOSIS — R030 Elevated blood-pressure reading, without diagnosis of hypertension: Secondary | ICD-10-CM | POA: Diagnosis not present

## 2022-02-20 DIAGNOSIS — F1120 Opioid dependence, uncomplicated: Secondary | ICD-10-CM | POA: Diagnosis not present

## 2022-02-20 DIAGNOSIS — E119 Type 2 diabetes mellitus without complications: Secondary | ICD-10-CM | POA: Diagnosis not present

## 2022-02-20 DIAGNOSIS — E559 Vitamin D deficiency, unspecified: Secondary | ICD-10-CM | POA: Diagnosis not present

## 2022-02-20 DIAGNOSIS — G894 Chronic pain syndrome: Secondary | ICD-10-CM | POA: Diagnosis not present

## 2022-02-20 DIAGNOSIS — G629 Polyneuropathy, unspecified: Secondary | ICD-10-CM | POA: Diagnosis not present

## 2022-03-12 ENCOUNTER — Other Ambulatory Visit: Payer: Self-pay | Admitting: Internal Medicine

## 2022-03-21 DIAGNOSIS — E559 Vitamin D deficiency, unspecified: Secondary | ICD-10-CM | POA: Diagnosis not present

## 2022-03-21 DIAGNOSIS — G629 Polyneuropathy, unspecified: Secondary | ICD-10-CM | POA: Diagnosis not present

## 2022-03-21 DIAGNOSIS — R030 Elevated blood-pressure reading, without diagnosis of hypertension: Secondary | ICD-10-CM | POA: Diagnosis not present

## 2022-03-21 DIAGNOSIS — Z6838 Body mass index (BMI) 38.0-38.9, adult: Secondary | ICD-10-CM | POA: Diagnosis not present

## 2022-03-21 DIAGNOSIS — F1120 Opioid dependence, uncomplicated: Secondary | ICD-10-CM | POA: Diagnosis not present

## 2022-03-21 DIAGNOSIS — G894 Chronic pain syndrome: Secondary | ICD-10-CM | POA: Diagnosis not present

## 2022-03-21 DIAGNOSIS — E119 Type 2 diabetes mellitus without complications: Secondary | ICD-10-CM | POA: Diagnosis not present

## 2022-03-21 DIAGNOSIS — Z79899 Other long term (current) drug therapy: Secondary | ICD-10-CM | POA: Diagnosis not present

## 2022-05-15 ENCOUNTER — Other Ambulatory Visit: Payer: Self-pay | Admitting: Internal Medicine

## 2022-05-19 ENCOUNTER — Other Ambulatory Visit: Payer: Self-pay | Admitting: Internal Medicine

## 2022-05-21 DIAGNOSIS — E119 Type 2 diabetes mellitus without complications: Secondary | ICD-10-CM | POA: Diagnosis not present

## 2022-05-21 DIAGNOSIS — Z6837 Body mass index (BMI) 37.0-37.9, adult: Secondary | ICD-10-CM | POA: Diagnosis not present

## 2022-05-21 DIAGNOSIS — G629 Polyneuropathy, unspecified: Secondary | ICD-10-CM | POA: Diagnosis not present

## 2022-05-21 DIAGNOSIS — R030 Elevated blood-pressure reading, without diagnosis of hypertension: Secondary | ICD-10-CM | POA: Diagnosis not present

## 2022-05-21 DIAGNOSIS — F1120 Opioid dependence, uncomplicated: Secondary | ICD-10-CM | POA: Diagnosis not present

## 2022-05-21 DIAGNOSIS — E559 Vitamin D deficiency, unspecified: Secondary | ICD-10-CM | POA: Diagnosis not present

## 2022-05-21 DIAGNOSIS — G894 Chronic pain syndrome: Secondary | ICD-10-CM | POA: Diagnosis not present

## 2022-05-21 DIAGNOSIS — Z79899 Other long term (current) drug therapy: Secondary | ICD-10-CM | POA: Diagnosis not present

## 2022-05-29 ENCOUNTER — Ambulatory Visit (INDEPENDENT_AMBULATORY_CARE_PROVIDER_SITE_OTHER): Payer: Medicare HMO | Admitting: Internal Medicine

## 2022-05-29 ENCOUNTER — Encounter: Payer: Self-pay | Admitting: Internal Medicine

## 2022-05-29 VITALS — BP 121/74 | HR 97 | Temp 98.5°F | Ht 70.0 in | Wt 263.0 lb

## 2022-05-29 DIAGNOSIS — R7989 Other specified abnormal findings of blood chemistry: Secondary | ICD-10-CM

## 2022-05-29 DIAGNOSIS — K219 Gastro-esophageal reflux disease without esophagitis: Secondary | ICD-10-CM | POA: Diagnosis not present

## 2022-05-29 DIAGNOSIS — G4733 Obstructive sleep apnea (adult) (pediatric): Secondary | ICD-10-CM | POA: Diagnosis not present

## 2022-05-29 DIAGNOSIS — Z0000 Encounter for general adult medical examination without abnormal findings: Secondary | ICD-10-CM | POA: Diagnosis not present

## 2022-05-29 DIAGNOSIS — F5101 Primary insomnia: Secondary | ICD-10-CM | POA: Diagnosis not present

## 2022-05-29 DIAGNOSIS — E785 Hyperlipidemia, unspecified: Secondary | ICD-10-CM

## 2022-05-29 DIAGNOSIS — I10 Essential (primary) hypertension: Secondary | ICD-10-CM

## 2022-05-29 DIAGNOSIS — G894 Chronic pain syndrome: Secondary | ICD-10-CM

## 2022-05-29 DIAGNOSIS — E1169 Type 2 diabetes mellitus with other specified complication: Secondary | ICD-10-CM

## 2022-05-29 DIAGNOSIS — J449 Chronic obstructive pulmonary disease, unspecified: Secondary | ICD-10-CM | POA: Diagnosis not present

## 2022-05-29 DIAGNOSIS — Z7984 Long term (current) use of oral hypoglycemic drugs: Secondary | ICD-10-CM

## 2022-05-29 DIAGNOSIS — J4520 Mild intermittent asthma, uncomplicated: Secondary | ICD-10-CM | POA: Diagnosis not present

## 2022-05-29 LAB — CBC
HCT: 44.2 % (ref 39.0–52.0)
Hemoglobin: 14.8 g/dL (ref 13.0–17.0)
MCHC: 33.5 g/dL (ref 30.0–36.0)
MCV: 88.1 fl (ref 78.0–100.0)
Platelets: 170 10*3/uL (ref 150.0–400.0)
RBC: 5.02 Mil/uL (ref 4.22–5.81)
RDW: 15.8 % — ABNORMAL HIGH (ref 11.5–15.5)
WBC: 6.5 10*3/uL (ref 4.0–10.5)

## 2022-05-29 LAB — LIPID PANEL
Cholesterol: 145 mg/dL (ref 0–200)
HDL: 52.6 mg/dL (ref >39.00)
LDL Cholesterol: 79 mg/dL (ref 0–99)
NonHDL: 92.46
Total CHOL/HDL Ratio: 3
Triglycerides: 66 mg/dL (ref 0.0–149.0)
VLDL: 13.2 mg/dL (ref 0.0–40.0)

## 2022-05-29 LAB — COMPREHENSIVE METABOLIC PANEL
ALT: 91 U/L — ABNORMAL HIGH (ref 0–53)
AST: 39 U/L — ABNORMAL HIGH (ref 0–37)
Albumin: 4.4 g/dL (ref 3.5–5.2)
Alkaline Phosphatase: 54 U/L (ref 39–117)
BUN: 12 mg/dL (ref 6–23)
CO2: 30 mEq/L (ref 19–32)
Calcium: 9.9 mg/dL (ref 8.4–10.5)
Chloride: 100 mEq/L (ref 96–112)
Creatinine, Ser: 0.73 mg/dL (ref 0.40–1.50)
GFR: 96.78 mL/min (ref >60.00)
Glucose, Bld: 145 mg/dL — ABNORMAL HIGH (ref 70–99)
Potassium: 3.9 mEq/L (ref 3.5–5.1)
Sodium: 140 mEq/L (ref 135–145)
Total Bilirubin: 1.2 mg/dL (ref 0.2–1.2)
Total Protein: 7.6 g/dL (ref 6.0–8.3)

## 2022-05-29 LAB — MICROALBUMIN / CREATININE URINE RATIO
Creatinine,U: 84.1 mg/dL
Microalb Creat Ratio: 0.8 mg/g (ref 0.0–30.0)
Microalb, Ur: <0.7 mg/dL (ref 0.0–1.9)

## 2022-05-29 LAB — HEMOGLOBIN A1C: Hgb A1c MFr Bld: 7 % — ABNORMAL HIGH (ref 4.6–6.5)

## 2022-05-29 NOTE — Assessment & Plan Note (Signed)
Never was able to get referral done previously. New referral done today. He is on disability due to chronic pain and has been stable on his hydrocodone for some time.

## 2022-05-29 NOTE — Assessment & Plan Note (Signed)
Checking CMP for stability. Adjust as needed. 

## 2022-05-29 NOTE — Assessment & Plan Note (Signed)
Checking lipid panel and adjust pravastatin 10 mg daily for goal LDL <100.

## 2022-05-29 NOTE — Patient Instructions (Signed)
We will check the labs today and work on getting another pain management clinic.

## 2022-05-29 NOTE — Progress Notes (Signed)
   Subjective:   Patient ID: Douglas Robinson, male    DOB: 20-Sep-1960, 62 y.o.   MRN: [id number]  HPI The patient is here for physical.  PMH, [facility], social history reviewed and updated  Review of Systems  Constitutional: Negative.   HENT: Negative.    Eyes: Negative.   Respiratory:  Negative for cough, chest tightness and shortness of breath.   Cardiovascular:  Negative for chest pain, palpitations and leg swelling.  Gastrointestinal:  Negative for abdominal distention, abdominal pain, constipation, diarrhea, nausea and vomiting.  Musculoskeletal:  Positive for arthralgias, back pain and myalgias.  Skin: Negative.   Neurological: Negative.   Psychiatric/Behavioral: Negative.      Objective:  Physical Exam Constitutional:      Appearance: He is well-developed. He is obese.  HENT:     Head: Normocephalic and atraumatic.  Cardiovascular:     Rate and Rhythm: Normal rate and regular rhythm.  Pulmonary:     Effort: Pulmonary effort is normal. No respiratory distress.     Breath sounds: Normal breath sounds. No wheezing or rales.  Abdominal:     General: Bowel sounds are normal. There is no distension.     Palpations: Abdomen is soft.     Tenderness: There is no abdominal tenderness. There is no rebound.  Musculoskeletal:     Cervical back: Normal range of motion.  Skin:    General: Skin is warm and dry.     Comments: Foot exam done  Neurological:     Mental Status: He is alert and oriented to person, place, and time.     Coordination: Coordination abnormal.     Comments: Slow to stand     Vitals:   05/29/22 0812  BP: 121/74  Pulse: 97  Temp: 98.5 F (36.9 C)  TempSrc: Oral  SpO2: 93%  Weight: 263 lb (119.3 kg)  Height: 5\' 10"  (1.778 m)    Assessment & Plan:

## 2022-05-29 NOTE — Assessment & Plan Note (Signed)
Did get CPAP and then due to financial reasons did not continue.

## 2022-05-29 NOTE — Assessment & Plan Note (Signed)
Checking HgA1c, lipid panel and CMP and microlabumin to creatinine ratio. Adjust as needed. Taking metformin 1000 mg BID and jardiance 25 mg daily and januvia 100 mg daily and glipizide 10 mg daily and acarbose 50 mg TID with meals. Goal Hga1c <8. On statin and ARB.

## 2022-05-29 NOTE — Assessment & Plan Note (Signed)
Controlled on protonix 20 mg daily and will continue same dosing.

## 2022-05-29 NOTE — Assessment & Plan Note (Signed)
Using trazodone 100 mg daily and mirtazapine 45 mg qhs and will continue for now. Can refill if needed.

## 2022-05-29 NOTE — Assessment & Plan Note (Signed)
No flare using albuterol prn. Overall stable will continue.

## 2022-05-29 NOTE — Assessment & Plan Note (Signed)
BMI 37 and complicated by diabetes and hypertension and hyperlipidemia. Counseled about diet and exercise to help with weight.

## 2022-05-29 NOTE — Assessment & Plan Note (Signed)
BP at goal on irbesartan 300 mg daily and hctz 25 mg daily and checking CMP and CBC. Adjust as needed.

## 2022-05-29 NOTE — Assessment & Plan Note (Signed)
Flu shot yearly. Pneumonia due at 63. Shingrix due at pharmacy. Tetanus up to date. Colonoscopy up to date. Counseled about sun safety and mole surveillance. Counseled about the dangers of distracted driving. Given 10 year screening recommendations.

## 2022-05-30 ENCOUNTER — Telehealth: Payer: Self-pay | Admitting: Internal Medicine

## 2022-05-30 ENCOUNTER — Other Ambulatory Visit: Payer: Self-pay

## 2022-05-30 MED ORDER — MIRTAZAPINE 45 MG PO TABS: ORAL_TABLET | ORAL | 3 refills | Status: DC

## 2022-05-30 NOTE — Telephone Encounter (Signed)
Ok to refill up to date on visit

## 2022-05-30 NOTE — Telephone Encounter (Signed)
Prescription Request  05/30/2022  LOV: 05/29/2022  What is the name of the medication or equipment? mirtazapine (REMERON) 45 MG tablet   Have you contacted your pharmacy to request a refill? Yes   Which pharmacy would you like this sent to?  [facility] - [place], [place] - [address] [place] [address] Phone: [phone] Fax: [phone]    Patient notified that their request is being sent to the clinical staff for review and that they should receive a response within 2 business days.   Please advise at [facility] [phone]

## 2022-06-08 ENCOUNTER — Other Ambulatory Visit: Payer: Self-pay | Admitting: Internal Medicine

## 2022-06-20 DIAGNOSIS — Z6838 Body mass index (BMI) 38.0-38.9, adult: Secondary | ICD-10-CM | POA: Diagnosis not present

## 2022-06-20 DIAGNOSIS — Z79899 Other long term (current) drug therapy: Secondary | ICD-10-CM | POA: Diagnosis not present

## 2022-06-20 DIAGNOSIS — E559 Vitamin D deficiency, unspecified: Secondary | ICD-10-CM | POA: Diagnosis not present

## 2022-06-20 DIAGNOSIS — E119 Type 2 diabetes mellitus without complications: Secondary | ICD-10-CM | POA: Diagnosis not present

## 2022-06-20 DIAGNOSIS — G894 Chronic pain syndrome: Secondary | ICD-10-CM | POA: Diagnosis not present

## 2022-06-20 DIAGNOSIS — R030 Elevated blood-pressure reading, without diagnosis of hypertension: Secondary | ICD-10-CM | POA: Diagnosis not present

## 2022-06-20 DIAGNOSIS — F1120 Opioid dependence, uncomplicated: Secondary | ICD-10-CM | POA: Diagnosis not present

## 2022-06-20 DIAGNOSIS — G629 Polyneuropathy, unspecified: Secondary | ICD-10-CM | POA: Diagnosis not present

## 2022-06-22 ENCOUNTER — Ambulatory Visit: Payer: Medicare HMO | Admitting: Podiatry

## 2022-07-20 DIAGNOSIS — Z6838 Body mass index (BMI) 38.0-38.9, adult: Secondary | ICD-10-CM | POA: Diagnosis not present

## 2022-07-20 DIAGNOSIS — R030 Elevated blood-pressure reading, without diagnosis of hypertension: Secondary | ICD-10-CM | POA: Diagnosis not present

## 2022-07-20 DIAGNOSIS — Z79899 Other long term (current) drug therapy: Secondary | ICD-10-CM | POA: Diagnosis not present

## 2022-07-20 DIAGNOSIS — F1120 Opioid dependence, uncomplicated: Secondary | ICD-10-CM | POA: Diagnosis not present

## 2022-07-20 DIAGNOSIS — E119 Type 2 diabetes mellitus without complications: Secondary | ICD-10-CM | POA: Diagnosis not present

## 2022-07-20 DIAGNOSIS — E559 Vitamin D deficiency, unspecified: Secondary | ICD-10-CM | POA: Diagnosis not present

## 2022-07-20 DIAGNOSIS — G629 Polyneuropathy, unspecified: Secondary | ICD-10-CM | POA: Diagnosis not present

## 2022-07-20 DIAGNOSIS — G894 Chronic pain syndrome: Secondary | ICD-10-CM | POA: Diagnosis not present

## 2022-07-27 ENCOUNTER — Ambulatory Visit: Payer: Medicare HMO | Admitting: Podiatry

## 2022-07-27 DIAGNOSIS — B351 Tinea unguium: Secondary | ICD-10-CM

## 2022-07-27 DIAGNOSIS — E1169 Type 2 diabetes mellitus with other specified complication: Secondary | ICD-10-CM | POA: Diagnosis not present

## 2022-07-27 DIAGNOSIS — M79674 Pain in right toe(s): Secondary | ICD-10-CM | POA: Diagnosis not present

## 2022-07-27 DIAGNOSIS — E785 Hyperlipidemia, unspecified: Secondary | ICD-10-CM

## 2022-07-27 DIAGNOSIS — M79675 Pain in left toe(s): Secondary | ICD-10-CM | POA: Diagnosis not present

## 2022-07-27 NOTE — Progress Notes (Signed)
  Subjective:  Patient ID: Douglas Robinson, male    DOB: 05/07/1960,  MRN: [id number]  Chief Complaint  Patient presents with   Foot Pain    Pt stated that he is here to have his feet looked at.    62 y.o. male returns for the above complaint.  Patient presents with thickened elongated dystrophic mycotic toenails x 10 mild pain on palpation.  Patient likely debride and is not able to do himself is a diabetic with last A1c of 7.0.  He denies any other acute complaints.  Objective:  There were no vitals filed for this visit. Podiatric Exam: Vascular: dorsalis pedis and posterior tibial pulses are palpable bilateral. Capillary return is immediate. Temperature gradient is WNL. Skin turgor WNL  Sensorium: Normal Semmes Weinstein monofilament test. Normal tactile sensation bilaterally. Nail Exam: Pt has thick disfigured discolored nails with subungual debris noted bilateral entire nail hallux through fifth toenails.  Pain on palpation to the nails. Ulcer Exam: There is no evidence of ulcer or pre-ulcerative changes or infection. Orthopedic Exam: Muscle tone and strength are WNL. No limitations in general ROM. No crepitus or effusions noted.  Skin: No Porokeratosis. No infection or ulcers    Assessment & Plan:   1. Pain due to onychomycosis of toenails of both feet   2. Type 2 diabetes mellitus with hyperlipidemia [facility])     Patient was evaluated and treated and all questions answered.  Onychomycosis with pain  -Nails palliatively debrided as below. -Educated on self-care  Procedure: Nail Debridement Rationale: pain  Type of Debridement: manual, sharp debridement. Instrumentation: Nail nipper, rotary burr. Number of Nails: 10  Procedures and Treatment: Consent by patient was obtained for treatment procedures. The patient understood the discussion of treatment and procedures well. All questions were answered thoroughly reviewed. Debridement of mycotic and hypertrophic toenails, 1  through 5 bilateral and clearing of subungual debris. No ulceration, no infection noted.  Return Visit-Office Procedure: Patient instructed to return to the office for a follow up visit 3 months for continued evaluation and treatment.  Douglas Robinson, DPM    No follow-ups on file.

## 2022-08-09 ENCOUNTER — Telehealth: Payer: Self-pay

## 2022-08-09 NOTE — Telephone Encounter (Signed)
Pt's pain management doctor is moving to another office and so pt will need to have a new pain management referral placed because they manage his Norco rx. His last appt with the doctor will be on 08/20/2022 and pt's wife is concerned because he is on a schedule to receive the medication every month before the 7th and he would need to get an appt with a new person to take over by the time his September rx would need to be filled.

## 2022-08-10 NOTE — Telephone Encounter (Signed)
We just did referral May 2024. Is this the provider he is currently seeing and if not he should follow up on referral done May 2024. If not I recommend to see another provider in same office if possible as pain referrals take months typically. If not able to see same office is it possible to follow provider to new location?

## 2022-08-20 DIAGNOSIS — E559 Vitamin D deficiency, unspecified: Secondary | ICD-10-CM | POA: Diagnosis not present

## 2022-08-20 DIAGNOSIS — G894 Chronic pain syndrome: Secondary | ICD-10-CM | POA: Diagnosis not present

## 2022-08-20 DIAGNOSIS — Z6838 Body mass index (BMI) 38.0-38.9, adult: Secondary | ICD-10-CM | POA: Diagnosis not present

## 2022-08-20 DIAGNOSIS — G629 Polyneuropathy, unspecified: Secondary | ICD-10-CM | POA: Diagnosis not present

## 2022-08-20 DIAGNOSIS — E6609 Other obesity due to excess calories: Secondary | ICD-10-CM | POA: Diagnosis not present

## 2022-08-20 DIAGNOSIS — Z79899 Other long term (current) drug therapy: Secondary | ICD-10-CM | POA: Diagnosis not present

## 2022-08-20 DIAGNOSIS — F1120 Opioid dependence, uncomplicated: Secondary | ICD-10-CM | POA: Diagnosis not present

## 2022-08-20 DIAGNOSIS — R030 Elevated blood-pressure reading, without diagnosis of hypertension: Secondary | ICD-10-CM | POA: Diagnosis not present

## 2022-08-20 DIAGNOSIS — E119 Type 2 diabetes mellitus without complications: Secondary | ICD-10-CM | POA: Diagnosis not present

## 2022-08-21 NOTE — Telephone Encounter (Signed)
Patient's wife called and said they need to get in with a new pain management doctor ASAP. She said she kept missing calls from the nurse and would like to speak with her. Best callback is [phone].

## 2022-08-22 NOTE — Telephone Encounter (Signed)
I have given the wife a call and relayed message back for the referral that was placed for new pain management

## 2022-09-07 ENCOUNTER — Other Ambulatory Visit: Payer: Self-pay | Admitting: Internal Medicine

## 2022-09-25 ENCOUNTER — Telehealth: Payer: Self-pay | Admitting: *Deleted

## 2022-09-25 ENCOUNTER — Ambulatory Visit (INDEPENDENT_AMBULATORY_CARE_PROVIDER_SITE_OTHER): Payer: Medicare HMO

## 2022-09-25 VITALS — Ht 70.0 in | Wt 264.0 lb

## 2022-09-25 DIAGNOSIS — Z599 Problem related to housing and economic circumstances, unspecified: Secondary | ICD-10-CM | POA: Diagnosis not present

## 2022-09-25 DIAGNOSIS — Z0000 Encounter for general adult medical examination without abnormal findings: Secondary | ICD-10-CM

## 2022-09-25 NOTE — Progress Notes (Signed)
Subjective:   Douglas Robinson is a 62 y.o. male who presents for Medicare Annual/Subsequent preventive examination.  Visit Complete: Virtual  I connected with  Erin Hearing on 09/25/22 by a audio enabled telemedicine application and verified that I am speaking with the correct person using two identifiers.  Patient Location: Home  Provider Location: Home Office  I discussed the limitations of evaluation and management by telemedicine. The patient expressed understanding and agreed to proceed.  Vital Signs: Because this visit was a virtual/telehealth visit, some criteria may be missing or patient reported. Any vitals not documented were not able to be obtained and vitals that have been documented are patient reported.    Review of Systems    Cardiac Risk Factors include: advanced age (>25men, >9 women);hypertension;male gender;diabetes mellitus;obesity (BMI >30kg/m2);dyslipidemia     Objective:    Today's Vitals   09/25/22 0854 09/25/22 0855  Weight: 264 lb (119.7 kg)   Height: 5\' 10"  (1.778 m)   PainSc:  8    Body mass index is 37.88 kg/m.     09/25/2022    9:03 AM 11/22/2021    8:52 AM 11/21/2020   11:18 AM 08/12/2019    9:31 AM 08/06/2018    9:20 AM 12/27/2014    7:08 AM 12/14/2014    9:19 AM  Advanced Directives  Does Patient Have a Medical Advance Directive? No No No No No No No  Would patient like information on creating a medical advance directive? No - Patient declined No - Patient declined No - Patient declined Yes (MAU/Ambulatory/Procedural Areas - Information given) Yes (ED - Information included in AVS)      Current Medications (verified) Outpatient Encounter Medications as of 09/25/2022  Medication Sig   acarbose (PRECOSE) 50 MG tablet TAKE 1 TABLET THREE TIMES DAILY WITH MEALS   ACCU-CHEK AVIVA PLUS test strip TEST BLOOD SUGAR TWICE DAILY   Accu-Chek Softclix Lancets lancets Use to check glucose as needed E11.9   albuterol (VENTOLIN HFA) 108 (90 Base)  MCG/ACT inhaler INHALE 1 TO 2 PUFFS EVERY 6 HOURS AS NEEDED FOR WHEEZING OR FOR SHORTNESS OF BREATH   aspirin 81 MG tablet Take 81 mg by mouth daily.   glipiZIDE (GLUCOTROL XL) 10 MG 24 hr tablet TAKE 1 TABLET EVERY DAY WITH BREAKFAST (APPOINTMENT IS NEEDED IN [facility] FOR REFILLS)   hydrochlorothiazide (HYDRODIURIL) 25 MG tablet TAKE 1 TABLET EVERY DAY   HYDROcodone-acetaminophen (NORCO) 10-325 MG per tablet Take 1 tablet by mouth every 6 (six) hours as needed.   irbesartan (AVAPRO) 300 MG tablet TAKE 1 TABLET EVERY DAY   JANUVIA 100 MG tablet TAKE 1 TABLET EVERY DAY   JARDIANCE 25 MG TABS tablet TAKE 1 TABLET EVERY DAY   metFORMIN (GLUCOPHAGE) 1000 MG tablet TAKE 1 TABLET TWICE DAILY WITH MEALS (NEED MD APPOINTMENT FOR REFILLS)   mirtazapine (REMERON) 45 MG tablet TAKE 1 TABLET AT BEDTIME. DISCONTINUE ANY OTHER DOSES OF REMERON.   Multiple Vitamin (MULTIVITAMIN) tablet Take 1 tablet by mouth daily.   ofloxacin (OCUFLOX) 0.3 % ophthalmic solution    pantoprazole (PROTONIX) 20 MG tablet TAKE 1 TABLET EVERY DAY   pravastatin (PRAVACHOL) 10 MG tablet TAKE 1 TABLET EVERY DAY   PRODIGY TWIST TOP LANCETS 28G MISC Use one lancet at each glucose check, as needed.   tiZANidine (ZANAFLEX) 4 MG tablet Take 1 tablet (4 mg total) by mouth every 6 (six) hours as needed for muscle spasms.   traZODone (DESYREL) 100 MG tablet Take 1 tablet by  mouth at bedtime.   vitamin B-12 (CYANOCOBALAMIN) 1000 MCG tablet Take 1,000 mcg by mouth daily.   No facility-administered encounter medications on file as of 09/25/2022.    Allergies (verified) Naproxen   History: Past Medical History:  Diagnosis Date   Anxiety    Cataract    COPD (chronic obstructive pulmonary disease) (HCC)    Diabetes (HCC)    GERD (gastroesophageal reflux disease)    HTN (hypertension)    Hyperlipidemia    OSA on CPAP    Sleep apnea    Past Surgical History:  Procedure Laterality Date   CARPAL TUNNEL RELEASE Right    COLONOSCOPY  2013    HERNIA REPAIR     POLYPECTOMY  2013   4 polyps-TA   REPAIR ANKLE LIGAMENT  1995   "Sometime before 1996"   Family History  Problem Relation Age of Onset   Cancer - Prostate Father    Colon polyps Father    Colon cancer Neg Hx    Rectal cancer Neg Hx    Stomach cancer Neg Hx    Esophageal cancer Neg Hx    Social History   Socioeconomic History   Marital status: Married    Spouse name: Lupita Leash   Number of children: 0   Years of education: 12th   Highest education level: Not on file  Occupational History   Occupation: disabilty  Tobacco Use   Smoking status: Never    Passive exposure: Never   Smokeless tobacco: Never  Vaping Use   Vaping status: Never Used  Substance and Sexual Activity   Alcohol use: No    Alcohol/week: 0.0 standard drinks of alcohol   Drug use: No   Sexual activity: Yes  Other Topics Concern   Not on file  Social History Narrative   Not on file   Social Determinants of Health   Financial Resource Strain: High Risk (09/25/2022)   Overall Financial Resource Strain (CARDIA)    Difficulty of Paying Living Expenses: Very hard  Food Insecurity: Food Insecurity Present (09/25/2022)   Hunger Vital Sign    Worried About Running Out of Food in the Last Year: Often true    Ran Out of Food in the Last Year: Often true  Transportation Needs: No Transportation Needs (09/25/2022)   PRAPARE - Administrator, Civil Service (Medical): No    Lack of Transportation (Non-Medical): No  Physical Activity: Insufficiently Active (09/25/2022)   Exercise Vital Sign    Days of Exercise per Week: 7 days    Minutes of Exercise per Session: 10 min  Stress: No Stress Concern Present (09/25/2022)   [employer] of Occupational Health - Occupational Stress Questionnaire    Feeling of Stress : Only a little  Social Connections: Socially Isolated (09/25/2022)   Social Connection and Isolation Panel [NHANES]    Frequency of Communication with Friends and Family:  Once a week    Frequency of Social Gatherings with Friends and Family: Never    Attends Religious Services: Never    Diplomatic Services operational officer: No    Attends Engineer, structural: Never    Marital Status: Married    Tobacco Counseling Counseling given: Not Answered   Clinical Intake:  Pre-visit preparation completed: Yes  Pain : 0-10 Pain Score: 8  Pain Type: Acute pain Pain Location: Foot Pain Orientation: Right Pain Descriptors / Indicators: Discomfort, Sharp Pain Onset: Other (comment) (going on long time) Pain Frequency: Constant  BMI - recorded: 37.88 Nutritional Status: BMI > 30  Obese Nutritional Risks: None Diabetes: Yes CBG done?: No Did pt. bring in CBG monitor from home?: No  How often do you need to have someone help you when you read instructions, pamphlets, or other written materials from your doctor or pharmacy?: 1 - Never  Interpreter Needed?: No  Information entered by :: Wenda Vanschaick, RMA   Activities of Daily Living    09/25/2022    8:59 AM 11/22/2021    8:50 AM  In your present state of health, do you have any difficulty performing the following activities:  Hearing? 0 0  Vision? 0 0  Difficulty concentrating or making decisions? 0 0  Walking or climbing stairs? 1 0  Comment due to rt foot   Dressing or bathing? 0 0  Doing errands, shopping? 0 0  Preparing Food and eating ? N N  Using the Toilet? N N  In the past six months, have you accidently leaked urine? N N  Do you have problems with loss of bowel control? N N  Managing your Medications? N N  Managing your Finances? N N  Housekeeping or managing your Housekeeping? N N    Patient Care Team: Myrlene Broker, MD as PCP - General (Internal Medicine) Rachael Fee, MD as Attending Physician (Gastroenterology) Jethro Bolus, MD as Consulting Physician (Ophthalmology) Merryl Hacker, NP as Nurse Practitioner (Nurse Practitioner) Ellin Saba, [facility] (Inactive) as Pharmacist (Pharmacist)  Indicate any recent Medical Services you may have received from other than [facility] providers in the past year (date may be approximate).     Assessment:   This is a routine wellness examination for Jemere.  Hearing/Vision screen Hearing Screening - Comments:: Denies hearing difficulties   Vision Screening - Comments:: Denies vision issues.   Goals Addressed               This Visit's Progress     Patient Stated (pt-stated)   On track     I'm ready to go back to the gym       Depression Screen    09/25/2022    9:10 AM 12/05/2021    8:28 AM 11/22/2021    9:33 AM 11/21/2020   11:20 AM 11/21/2020   11:16 AM 05/06/2020    8:19 AM 08/12/2019    9:26 AM  PHQ 2/9 Scores  PHQ - 2 Score 0 0 2 0 0 0 0  PHQ- 9 Score 0 0 5        Fall Risk    09/25/2022    9:03 AM 05/29/2022    8:11 AM 12/05/2021    8:28 AM 11/22/2021    8:54 AM 09/08/2021    8:09 AM  Fall Risk   Falls in the past year? 0 0 0 0 0  Number falls in past yr: 0 0 0 0 0  Injury with Fall? 0 0 0 0 0  Risk for fall due to : No Fall Risks   No Fall Risks No Fall Risks  Follow up Falls prevention discussed;Falls evaluation completed Falls evaluation completed Falls evaluation completed Falls evaluation completed Falls evaluation completed    MEDICARE RISK AT HOME: Medicare Risk at Home Any stairs in or around the home?: Yes If so, are there any without handrails?: Yes Home free of loose throw rugs in walkways, pet beds, electrical cords, etc?: No Adequate lighting in your home to reduce risk of falls?: No Life alert?: No Use  of a cane, walker or w/c?: No Grab bars in the bathroom?: No Shower chair or bench in shower?: No Elevated toilet seat or a handicapped toilet?: No  TIMED UP AND GO:  Was the test performed?  No    Cognitive Function:        09/25/2022    9:05 AM 11/22/2021    9:00 AM  6CIT Screen  What Year? 0 points 0 points  What month? 0 points 0  points  What time? 0 points 0 points  Count back from 20 0 points 0 points  Months in reverse 4 points 2 points  Repeat phrase 0 points 2 points  Total Score 4 points 4 points    Immunizations Immunization History  Administered Date(s) Administered   Fluad Quad(high Dose 65+) 10/10/2018   Influenza,inj,Quad PF,6+ Mos 09/25/2017, 09/23/2018   Influenza-Unspecified 09/23/2017, 11/16/2019, 11/01/2020   Moderna Sars-Covid-2 Vaccination 05/19/2019, 06/16/2019, 01/25/2020   PFIZER SARS-COV-2 Pediatric Vaccination 5-58yrs 07/28/2020   Pfizer Covid-19 Vaccine Bivalent Booster 44yrs & up 11/02/2020   Pneumococcal Polysaccharide-23 09/13/2011   Tdap 11/06/2017    TDAP status: Up to date  Flu Vaccine status: Due, Education has been provided regarding the importance of this vaccine. Advised may receive this vaccine at local pharmacy or Health Dept. Aware to provide a copy of the vaccination record if obtained from local pharmacy or Health Dept. Verbalized acceptance and understanding.  Pneumococcal vaccine status: Up to date  Covid-19 vaccine status: Information provided on how to obtain vaccines.   Qualifies for Shingles Vaccine? Yes   Zostavax completed No   Shingrix Completed?: No.    Education has been provided regarding the importance of this vaccine. Patient has been advised to call insurance company to determine out of pocket expense if they have not yet received this vaccine. Advised may also receive vaccine at local pharmacy or Health Dept. Verbalized acceptance and understanding.  Screening Tests Health Maintenance  Topic Date Due   HIV Screening  Never done   Zoster Vaccines- Shingrix (1 of 2) Never done   OPHTHALMOLOGY EXAM  10/27/2019   INFLUENZA VACCINE  08/16/2022   COVID-19 Vaccine (5 - 2023-24 season) 09/16/2022   HEMOGLOBIN A1C  11/29/2022   Diabetic kidney evaluation - eGFR measurement  05/29/2023   Diabetic kidney evaluation - Urine ACR  05/29/2023   FOOT EXAM   05/29/2023   Medicare Annual Wellness (AWV)  09/25/2023   Colonoscopy  08/03/2027   DTaP/Tdap/Td (2 - Td or Tdap) 11/07/2027   Hepatitis C Screening  Completed   HPV VACCINES  Aged Out    Health Maintenance  Health Maintenance Due  Topic Date Due   HIV Screening  Never done   Zoster Vaccines- Shingrix (1 of 2) Never done   OPHTHALMOLOGY EXAM  10/27/2019   INFLUENZA VACCINE  08/16/2022   COVID-19 Vaccine (5 - 2023-24 season) 09/16/2022    Colorectal cancer screening: Type of screening: Colonoscopy. Completed 08/02/2020. Repeat every 7 years  Lung Cancer Screening: (Low Dose CT Chest recommended if Age 23-80 years, 20 pack-year currently smoking OR have quit w/in 15years.) does not qualify.   Lung Cancer Screening Referral: N/A  Additional Screening:  Hepatitis C Screening: does qualify; Completed 08/29/2017  Vision Screening: Recommended annual ophthalmology exams for early detection of glaucoma and other disorders of the eye. Is the patient up to date with their annual eye exam?  Yes  Who is the provider or what is the name of the office in which the patient  attends annual eye exams? Looking for a new provider. If pt is not established with a provider, would they like to be referred to a provider to establish care? No .   Dental Screening: Recommended annual dental exams for proper oral hygiene  Diabetic Foot Exam: Diabetic Foot Exam: Completed 05/29/2022  Community Resource Referral / Chronic Care Management: CRR required this visit?  Yes   CCM required this visit?  No     Plan:     I have personally reviewed and noted the following in the patient's chart:   Medical and social history Use of alcohol, tobacco or illicit drugs  Current medications and supplements including opioid prescriptions. Patient is currently taking opioid prescriptions. Information provided to patient regarding non-opioid alternatives. Patient advised to discuss non-opioid treatment plan with  their provider. Functional ability and status Nutritional status Physical activity Advanced directives List of other physicians Hospitalizations, surgeries, and ER visits in previous 12 months Vitals Screenings to include cognitive, depression, and falls Referrals and appointments  In addition, I have reviewed and discussed with patient certain preventive protocols, quality metrics, and best practice recommendations. A written personalized care plan for preventive services as well as general preventive health recommendations were provided to patient.     Icesis Renn L Elowen Debruyn, CMA   09/25/2022   After Visit Summary: (Mail) Due to this being a telephonic visit, the after visit summary with patients personalized plan was offered to patient via mail   Nurse Notes: Patient is due for Flu and Shingles Vaccines.  He is also due for an eye exam, which he stated that he is in the process of looking for one and will schedule by October.  Patient complained of rent going up almost each month.  I placed a [employer] referral for patient today.  He had no other concerns to address today.

## 2022-09-25 NOTE — Progress Notes (Signed)
  Care Coordination   Note   09/25/2022 Name: Douglas Robinson MRN: [id number] DOB: 1960-05-15  Douglas Robinson is a 63 y.o. year old male who sees Myrlene Broker, MD for primary care. I reached out to Erin Hearing by phone today to offer care coordination services.  Mr. Palm was given information about Care Coordination services today including:   The Care Coordination services include support from the care team which includes your Nurse Coordinator, Clinical Social Worker, or Pharmacist.  The Care Coordination team is here to help remove barriers to the health concerns and goals most important to you. Care Coordination services are voluntary, and the patient may decline or stop services at any time by request to their care team member.   Care Coordination Consent Status: Patient agreed to services and verbal consent obtained.   Follow up plan:  Telephone appointment with care coordination team member scheduled for:  10/05/2022  Encounter Outcome:  Patient Scheduled from referral    Burman Nieves, [facility] Care Coordination Care Guide Direct Dial: [phone]

## 2022-09-27 NOTE — Telephone Encounter (Signed)
I'm not sure I see information to make a decision. Have them followed through on my prior recommendations? (Either stay at same office with different provider, follow provider to new location, f/u with referral placed months ago and have upcoming apt with a new provider). How many days left of medication do they have? We cannot prescribe a controlled substance without a visit so if medication may be prescribed they would need a visit can we make them one in case one is needed can be cancelled if not needed.

## 2022-09-27 NOTE — Telephone Encounter (Signed)
Pt wife states that she reached out to another provider at [facility] back in August and they haven't gotten around to refill his medication. She is asking if there is anything else stronger OTC to help him until they are seen? She says now he is completely out of medication and in a lot of pain its dangerous for him to drive and she doesn't and he is on disability due to the severe pain he is in.

## 2022-09-27 NOTE — Telephone Encounter (Signed)
The patient and his wife came by the office, states that they are out of hydrocodone (patient's pain medicine) and they are waiting for an appointment with the pain management doctor, they would like to know what they should do? Best callback number is [phone].

## 2022-10-01 NOTE — Progress Notes (Signed)
Medical screening examination/treatment/procedure(s) were performed by non-physician practitioner and as supervising physician I was immediately available for consultation/collaboration. I agree with above. Myrlene Broker, MD

## 2022-10-05 ENCOUNTER — Ambulatory Visit: Payer: Self-pay

## 2022-10-05 NOTE — Patient Outreach (Signed)
Care Coordination   Initial Visit Note   10/05/2022 Name: Douglas Robinson MRN: [id number] DOB: [birth year]  Douglas Robinson is a 62 y.o. year old male who sees Myrlene Broker, MD for primary care. I  spoke with the patients spouse by phone to assist with care coordination needs  What matters to the patients health and wellness today?  Identify resources to assist with rising rental costs    Goals Addressed             This Visit's Progress    COMPLETED: Care Coordination Activities       Care Coordination Interventions: Discussed the patients rent continues to increase. Patient is interested in resources to help lower rent Determined the patient and his spouse applied for Section 8 two years ago but has not received vouchers Advised the patient to follow up with [employer] to request an update on wait list status Provided patient with contact number to the housing authority Discussed there are no other resources that assist with long-term rental assistance         SDOH assessments and interventions completed:  No     Care Coordination Interventions:  Yes, provided   Interventions Today    Flowsheet Row Most Recent Value  Chronic Disease   Chronic disease during today's visit Other  [Financial Resource Strain]  General Interventions   General Interventions Discussed/Reviewed General Interventions Discussed, [employer] patient has difficulty affording rent,  instructed to follow up with [facility] re: status of Section 8]        Follow up plan: No further intervention required.   Encounter Outcome:  Patient Visit Completed   Bevelyn Ngo, BSW, CDP [facility]  [facility], [facility] Social Worker Direct Dial: [phone]  Fax: [phone]

## 2022-10-05 NOTE — Patient Instructions (Signed)
Visit Information  Thank you for taking time to visit with me today. Please don't hesitate to contact me if I can be of assistance to you.   Following are the goals we discussed today:  - Call [employer] at [phone]  If you are experiencing a Mental Health or Behavioral Health Crisis or need someone to talk to, please call 1-800-273-TALK (toll free, 24 hour hotline) go to [facility] [address], [place] [phone]) call 911  The patient verbalized understanding of instructions, educational materials, and care plan provided today and DECLINED offer to receive copy of patient instructions, educational materials, and care plan.   No further follow up required: Please contact your primary care provider as needed  Bevelyn Ngo, BSW, CDP [facility]  [facility], [facility] Social Worker Direct Dial: [phone]  Fax: [phone]

## 2022-10-09 ENCOUNTER — Ambulatory Visit: Payer: Medicare HMO | Admitting: Internal Medicine

## 2022-10-09 ENCOUNTER — Encounter: Payer: Self-pay | Admitting: Internal Medicine

## 2022-10-09 VITALS — BP 122/100 | HR 97 | Temp 98.1°F | Ht 70.0 in | Wt 259.0 lb

## 2022-10-09 DIAGNOSIS — E785 Hyperlipidemia, unspecified: Secondary | ICD-10-CM

## 2022-10-09 DIAGNOSIS — M79672 Pain in left foot: Secondary | ICD-10-CM | POA: Diagnosis not present

## 2022-10-09 DIAGNOSIS — M79671 Pain in right foot: Secondary | ICD-10-CM

## 2022-10-09 DIAGNOSIS — R7989 Other specified abnormal findings of blood chemistry: Secondary | ICD-10-CM

## 2022-10-09 DIAGNOSIS — E1169 Type 2 diabetes mellitus with other specified complication: Secondary | ICD-10-CM | POA: Diagnosis not present

## 2022-10-09 DIAGNOSIS — Z7984 Long term (current) use of oral hypoglycemic drugs: Secondary | ICD-10-CM | POA: Diagnosis not present

## 2022-10-09 DIAGNOSIS — Z23 Encounter for immunization: Secondary | ICD-10-CM | POA: Diagnosis not present

## 2022-10-09 DIAGNOSIS — G894 Chronic pain syndrome: Secondary | ICD-10-CM | POA: Diagnosis not present

## 2022-10-09 DIAGNOSIS — I10 Essential (primary) hypertension: Secondary | ICD-10-CM | POA: Diagnosis not present

## 2022-10-09 LAB — POCT GLYCOSYLATED HEMOGLOBIN (HGB A1C): HbA1c POC (<> result, manual entry): 6.8 % (ref 4.0–5.6)

## 2022-10-09 MED ORDER — HYDROCODONE-ACETAMINOPHEN 10-325 MG PO TABS: 1.0000 | ORAL_TABLET | Freq: Three times a day (TID) | ORAL | 0 refills | Status: AC | PRN

## 2022-10-09 NOTE — Assessment & Plan Note (Signed)
POC HgA1c testing done and improved to 6.8. Will keep medications the same with jardiance 25 mg daily and metformin 1000 mg BID and januvia 100 mg daily and glipizide 10 mg daily acarbose 50 mg TID.

## 2022-10-09 NOTE — Progress Notes (Signed)
Subjective:   Patient ID: Douglas Robinson, male    DOB: 1960/04/30, 62 y.o.   MRN: [id number]  HPI The patient is a 62 YO man coming in for concerns about pain management, needs follow up diabetes and hypertension.  Review of Systems  Constitutional: Negative.   HENT: Negative.    Eyes: Negative.   Respiratory:  Negative for cough, chest tightness and shortness of breath.   Cardiovascular:  Negative for chest pain, palpitations and leg swelling.  Gastrointestinal:  Negative for abdominal distention, abdominal pain, constipation, diarrhea, nausea and vomiting.  Musculoskeletal:  Positive for arthralgias, back pain, gait problem and myalgias.  Skin: Negative.   Neurological:  Positive for numbness.  Psychiatric/Behavioral: Negative.      Objective:  Physical Exam Constitutional:      Appearance: He is well-developed.  HENT:     Head: Normocephalic and atraumatic.  Cardiovascular:     Rate and Rhythm: Normal rate and regular rhythm.  Pulmonary:     Effort: Pulmonary effort is normal. No respiratory distress.     Breath sounds: Normal breath sounds. No wheezing or rales.  Abdominal:     General: Bowel sounds are normal. There is no distension.     Palpations: Abdomen is soft.     Tenderness: There is no abdominal tenderness. There is no rebound.  Musculoskeletal:        General: Tenderness present.     Cervical back: Normal range of motion.  Skin:    General: Skin is warm and dry.  Neurological:     Mental Status: He is alert and oriented to person, place, and time.     Coordination: Coordination normal.     Vitals:   10/09/22 0829 10/09/22 0833  BP: (!) 122/100 (!) 122/100  Pulse: 97   Temp: 98.1 F (36.7 C)   TempSrc: Oral   SpO2: 95%   Weight: 259 lb (117.5 kg)   Height: 5\' 10"  (1.778 m)     Assessment & Plan:  Flu shot given at visit

## 2022-10-09 NOTE — Patient Instructions (Signed)
Your HgA1c is 6.8. We have sent in hydrocodone to the pharmacy.

## 2022-10-09 NOTE — Assessment & Plan Note (Signed)
In a lot of pain due to no medication for 3 weeks. Prior BP at goal. Will keep current regimen of irbesartan 300 mg daily, hydrochlorothiazide 25 mg daily. Recent BMP at goal. Needs close monitoring of BP.

## 2022-10-09 NOTE — Assessment & Plan Note (Signed)
Recently his pain provider left and he has been without medication for 3 weeks. Rx hydrocodone/apap 10/325 #90 and will fill for up to 3 months until he is able to find a new provider.

## 2022-10-09 NOTE — Assessment & Plan Note (Signed)
Recent CMP stable and needs monitoring every 6-12 months. Likely related to fatty liver disease.

## 2022-10-09 NOTE — Assessment & Plan Note (Signed)
He has been unable to secure an apt at another clinic. He has been calling [facility] and sent records there. He is on disability for foot pain related to prior injury and back pain. Taking hydrocodone 10/325 3 per day previously. Will fill for up to 3 months while he is finding another provider since his left abruptly. Rx done today for #90 hydrocodone/apap 10/325.

## 2022-10-17 ENCOUNTER — Telehealth: Payer: Self-pay | Admitting: Internal Medicine

## 2022-10-17 DIAGNOSIS — G894 Chronic pain syndrome: Secondary | ICD-10-CM

## 2022-10-17 DIAGNOSIS — M79671 Pain in right foot: Secondary | ICD-10-CM

## 2022-10-17 NOTE — Telephone Encounter (Signed)
Patient's wife called and said they are trying to get in with a pain management clinic. They were told they need a referral from patient's PCP. They would like to know if the patient can be referred to [facility]. Patient's last OV was 10/09/2022. Best callback is [phone].

## 2022-10-17 NOTE — Telephone Encounter (Signed)
Called patient wife and informed her of this

## 2022-10-17 NOTE — Telephone Encounter (Signed)
Referral placed.

## 2022-11-02 ENCOUNTER — Ambulatory Visit: Payer: Medicare HMO | Admitting: Podiatry

## 2022-11-08 DIAGNOSIS — M19072 Primary osteoarthritis, left ankle and foot: Secondary | ICD-10-CM | POA: Diagnosis not present

## 2022-11-08 DIAGNOSIS — M67961 Unspecified disorder of synovium and tendon, right lower leg: Secondary | ICD-10-CM | POA: Diagnosis not present

## 2022-11-08 DIAGNOSIS — M79671 Pain in right foot: Secondary | ICD-10-CM | POA: Diagnosis not present

## 2022-11-26 DIAGNOSIS — M19072 Primary osteoarthritis, left ankle and foot: Secondary | ICD-10-CM | POA: Diagnosis not present

## 2022-11-26 DIAGNOSIS — Z5181 Encounter for therapeutic drug level monitoring: Secondary | ICD-10-CM | POA: Diagnosis not present

## 2022-11-26 DIAGNOSIS — M67961 Unspecified disorder of synovium and tendon, right lower leg: Secondary | ICD-10-CM | POA: Diagnosis not present

## 2022-11-26 DIAGNOSIS — Z79899 Other long term (current) drug therapy: Secondary | ICD-10-CM | POA: Diagnosis not present

## 2022-12-06 DIAGNOSIS — M19072 Primary osteoarthritis, left ankle and foot: Secondary | ICD-10-CM | POA: Diagnosis not present

## 2022-12-06 DIAGNOSIS — Z79899 Other long term (current) drug therapy: Secondary | ICD-10-CM | POA: Diagnosis not present

## 2022-12-06 DIAGNOSIS — M67961 Unspecified disorder of synovium and tendon, right lower leg: Secondary | ICD-10-CM | POA: Diagnosis not present

## 2022-12-11 DIAGNOSIS — H524 Presbyopia: Secondary | ICD-10-CM | POA: Diagnosis not present

## 2022-12-11 DIAGNOSIS — H5203 Hypermetropia, bilateral: Secondary | ICD-10-CM | POA: Diagnosis not present

## 2022-12-11 DIAGNOSIS — H2513 Age-related nuclear cataract, bilateral: Secondary | ICD-10-CM | POA: Diagnosis not present

## 2022-12-11 DIAGNOSIS — H52223 Regular astigmatism, bilateral: Secondary | ICD-10-CM | POA: Diagnosis not present

## 2023-01-24 ENCOUNTER — Encounter: Payer: Self-pay | Admitting: Internal Medicine

## 2023-01-24 ENCOUNTER — Ambulatory Visit (INDEPENDENT_AMBULATORY_CARE_PROVIDER_SITE_OTHER): Payer: Medicare HMO | Admitting: Internal Medicine

## 2023-01-24 VITALS — BP 118/88 | HR 94 | Temp 98.4°F | Ht 70.0 in | Wt 269.0 lb

## 2023-01-24 DIAGNOSIS — Z7984 Long term (current) use of oral hypoglycemic drugs: Secondary | ICD-10-CM

## 2023-01-24 DIAGNOSIS — E785 Hyperlipidemia, unspecified: Secondary | ICD-10-CM | POA: Diagnosis not present

## 2023-01-24 DIAGNOSIS — E1169 Type 2 diabetes mellitus with other specified complication: Secondary | ICD-10-CM | POA: Diagnosis not present

## 2023-01-24 LAB — POCT GLYCOSYLATED HEMOGLOBIN (HGB A1C): HbA1c POC (<> result, manual entry): 7.9 % (ref 4.0–5.6)

## 2023-01-24 NOTE — Progress Notes (Signed)
   Subjective:   Patient ID: Douglas Robinson, male    DOB: 04-26-1960, 63 y.o.   MRN: [id number]  HPI The patient is a 63 YO man coming in for medical management.   Review of Systems  Constitutional:  Positive for activity change. Negative for appetite change, fatigue, fever and unexpected weight change.  Respiratory: Negative.    Cardiovascular: Negative.   Musculoskeletal:  Positive for back pain and myalgias. Negative for arthralgias.  Skin: Negative.   Neurological:  Negative for syncope, weakness and numbness.    Objective:  Physical Exam Constitutional:      Appearance: He is well-developed.  HENT:     Head: Normocephalic and atraumatic.  Cardiovascular:     Rate and Rhythm: Normal rate and regular rhythm.  Pulmonary:     Effort: Pulmonary effort is normal. No respiratory distress.     Breath sounds: Normal breath sounds. No wheezing or rales.  Abdominal:     General: Bowel sounds are normal. There is no distension.     Palpations: Abdomen is soft.     Tenderness: There is no abdominal tenderness. There is no rebound.  Musculoskeletal:        General: Tenderness present.     Cervical back: Normal range of motion.  Skin:    General: Skin is warm and dry.  Neurological:     Mental Status: He is alert and oriented to person, place, and time.     Coordination: Coordination normal.     Vitals:   01/24/23 0800  BP: 118/88  Pulse: 94  Temp: 98.4 F (36.9 C)  TempSrc: Oral  SpO2: 96%  Weight: 269 lb (122 kg)  Height: 5' 10 (1.778 m)    Assessment & Plan:

## 2023-01-24 NOTE — Assessment & Plan Note (Signed)
 POC HgA1c done today and elevated from prior at 7.9. He is still technically at goal. He does want to work on diet and movement to help. Given information about diet as well as counseled during visit. We will continue acarbose  50 mg TID and glipizide  10 mg daily and januvia  100 mg daily and jardiance  25 mg daily and metformin  1000 mg BID. He is on statin and ARB. Close follow up 3 months. If not at goal then will change januvia  to glp-1 instead.

## 2023-01-24 NOTE — Patient Instructions (Signed)
 The HgA1c is 7.9 so work on diet to help with this.

## 2023-02-20 ENCOUNTER — Ambulatory Visit: Payer: Medicare HMO | Admitting: Internal Medicine

## 2023-02-21 ENCOUNTER — Telehealth: Payer: Self-pay

## 2023-02-21 NOTE — Telephone Encounter (Signed)
 Form is placed upfront and ready to be picked up

## 2023-02-25 ENCOUNTER — Other Ambulatory Visit: Payer: Self-pay | Admitting: Internal Medicine

## 2023-02-26 DIAGNOSIS — M67961 Unspecified disorder of synovium and tendon, right lower leg: Secondary | ICD-10-CM | POA: Diagnosis not present

## 2023-02-26 DIAGNOSIS — Z79899 Other long term (current) drug therapy: Secondary | ICD-10-CM | POA: Diagnosis not present

## 2023-02-26 DIAGNOSIS — M19072 Primary osteoarthritis, left ankle and foot: Secondary | ICD-10-CM | POA: Diagnosis not present

## 2023-03-06 ENCOUNTER — Other Ambulatory Visit: Payer: Self-pay | Admitting: Internal Medicine

## 2023-03-26 ENCOUNTER — Other Ambulatory Visit: Payer: Self-pay | Admitting: Internal Medicine

## 2023-04-22 DIAGNOSIS — M67961 Unspecified disorder of synovium and tendon, right lower leg: Secondary | ICD-10-CM | POA: Diagnosis not present

## 2023-04-22 DIAGNOSIS — M19072 Primary osteoarthritis, left ankle and foot: Secondary | ICD-10-CM | POA: Diagnosis not present

## 2023-04-22 DIAGNOSIS — Z79899 Other long term (current) drug therapy: Secondary | ICD-10-CM | POA: Diagnosis not present

## 2023-05-10 ENCOUNTER — Encounter: Payer: Self-pay | Admitting: Internal Medicine

## 2023-05-10 ENCOUNTER — Ambulatory Visit: Admitting: Internal Medicine

## 2023-05-10 VITALS — BP 128/80 | HR 94 | Temp 98.4°F | Ht 70.0 in | Wt 263.0 lb

## 2023-05-10 DIAGNOSIS — E1169 Type 2 diabetes mellitus with other specified complication: Secondary | ICD-10-CM | POA: Diagnosis not present

## 2023-05-10 DIAGNOSIS — Z7984 Long term (current) use of oral hypoglycemic drugs: Secondary | ICD-10-CM | POA: Diagnosis not present

## 2023-05-10 DIAGNOSIS — E785 Hyperlipidemia, unspecified: Secondary | ICD-10-CM | POA: Diagnosis not present

## 2023-05-10 LAB — POCT GLYCOSYLATED HEMOGLOBIN (HGB A1C): HbA1c POC (<> result, manual entry): 7.5 % (ref 4.0–5.6)

## 2023-05-10 NOTE — Assessment & Plan Note (Signed)
 Weight down 5 pounds since last visit and given encouragement to continue. Continue to monitor at visits.

## 2023-05-10 NOTE — Assessment & Plan Note (Signed)
 HgA1c POC done and improved to 7.5 which is at goal. He will continue januvia  and jardiance  and metformin  and glipizide  and acarbose . Is on statin and ARB. Continue follow up 3 months.

## 2023-05-10 NOTE — Patient Instructions (Signed)
 Get the RSV after your birthday.   Get the shingles vaccine shingrix at the pharmacy.

## 2023-05-10 NOTE — Progress Notes (Signed)
   Subjective:   Patient ID: Douglas Robinson, male    DOB: January 11, 1961, 63 y.o.   MRN: [id number]  HPI The patient is here for medical management (see A/P for details).   Review of Systems  Constitutional: Negative.   HENT: Negative.    Eyes: Negative.   Respiratory:  Negative for cough, chest tightness and shortness of breath.   Cardiovascular:  Negative for chest pain, palpitations and leg swelling.  Gastrointestinal:  Negative for abdominal distention, abdominal pain, constipation, diarrhea, nausea and vomiting.  Musculoskeletal:  Positive for arthralgias, gait problem and myalgias.  Skin: Negative.   Neurological:  Positive for numbness.  Psychiatric/Behavioral: Negative.      Objective:  Physical Exam Constitutional:      Appearance: He is well-developed.  HENT:     Head: Normocephalic and atraumatic.  Cardiovascular:     Rate and Rhythm: Normal rate and regular rhythm.  Pulmonary:     Effort: Pulmonary effort is normal. No respiratory distress.     Breath sounds: Normal breath sounds. No wheezing or rales.  Abdominal:     General: Bowel sounds are normal. There is no distension.     Palpations: Abdomen is soft.     Tenderness: There is no abdominal tenderness. There is no rebound.  Musculoskeletal:        General: Tenderness present.     Cervical back: Normal range of motion.  Skin:    General: Skin is warm and dry.  Neurological:     Mental Status: He is alert and oriented to person, place, and time.     Sensory: Sensory deficit present.     Coordination: Coordination normal.     Vitals:   05/10/23 0803  BP: 128/80  Pulse: 94  Temp: 98.4 F (36.9 C)  TempSrc: Oral  SpO2: 96%  Weight: 263 lb (119.3 kg)  Height: 5\' 10"  (1.778 m)    Assessment & Plan:

## 2023-06-22 ENCOUNTER — Other Ambulatory Visit: Payer: Self-pay | Admitting: Internal Medicine

## 2023-06-28 ENCOUNTER — Other Ambulatory Visit: Payer: Self-pay | Admitting: Internal Medicine

## 2023-07-22 DIAGNOSIS — Z79899 Other long term (current) drug therapy: Secondary | ICD-10-CM | POA: Diagnosis not present

## 2023-07-22 DIAGNOSIS — M67961 Unspecified disorder of synovium and tendon, right lower leg: Secondary | ICD-10-CM | POA: Diagnosis not present

## 2023-07-22 DIAGNOSIS — M19072 Primary osteoarthritis, left ankle and foot: Secondary | ICD-10-CM | POA: Diagnosis not present

## 2023-07-31 ENCOUNTER — Other Ambulatory Visit: Payer: Self-pay | Admitting: Internal Medicine

## 2023-08-23 ENCOUNTER — Encounter: Payer: Self-pay | Admitting: Internal Medicine

## 2023-08-23 ENCOUNTER — Ambulatory Visit: Admitting: Internal Medicine

## 2023-08-23 ENCOUNTER — Encounter: Admitting: Internal Medicine

## 2023-08-23 VITALS — BP 120/80 | HR 102 | Temp 98.7°F | Ht 70.0 in | Wt 270.0 lb

## 2023-08-23 DIAGNOSIS — E785 Hyperlipidemia, unspecified: Secondary | ICD-10-CM

## 2023-08-23 DIAGNOSIS — Z7984 Long term (current) use of oral hypoglycemic drugs: Secondary | ICD-10-CM

## 2023-08-23 DIAGNOSIS — R7989 Other specified abnormal findings of blood chemistry: Secondary | ICD-10-CM | POA: Diagnosis not present

## 2023-08-23 DIAGNOSIS — G894 Chronic pain syndrome: Secondary | ICD-10-CM

## 2023-08-23 DIAGNOSIS — I10 Essential (primary) hypertension: Secondary | ICD-10-CM

## 2023-08-23 DIAGNOSIS — Z0000 Encounter for general adult medical examination without abnormal findings: Secondary | ICD-10-CM

## 2023-08-23 DIAGNOSIS — E1169 Type 2 diabetes mellitus with other specified complication: Secondary | ICD-10-CM

## 2023-08-23 LAB — CBC
HCT: 45.5 % (ref 39.0–52.0)
Hemoglobin: 14.8 g/dL (ref 13.0–17.0)
MCHC: 32.6 g/dL (ref 30.0–36.0)
MCV: 89.4 fl (ref 78.0–100.0)
Platelets: 164 K/uL (ref 150.0–400.0)
RBC: 5.08 Mil/uL (ref 4.22–5.81)
RDW: 15.5 % (ref 11.5–15.5)
WBC: 8.9 K/uL (ref 4.0–10.5)

## 2023-08-23 LAB — MICROALBUMIN / CREATININE URINE RATIO
Creatinine,U: 72 mg/dL
Microalb Creat Ratio: UNDETERMINED mg/g (ref 0.0–30.0)
Microalb, Ur: <0.7 mg/dL

## 2023-08-23 LAB — COMPREHENSIVE METABOLIC PANEL WITH GFR
ALT: 100 U/L — ABNORMAL HIGH (ref 0–53)
AST: 47 U/L — ABNORMAL HIGH (ref 0–37)
Albumin: 4.4 g/dL (ref 3.5–5.2)
Alkaline Phosphatase: 57 U/L (ref 39–117)
BUN: 17 mg/dL (ref 6–23)
CO2: 28 meq/L (ref 19–32)
Calcium: 9.6 mg/dL (ref 8.4–10.5)
Chloride: 101 meq/L (ref 96–112)
Creatinine, Ser: 0.68 mg/dL (ref 0.40–1.50)
GFR: 98.03 mL/min (ref >60.00)
Glucose, Bld: 147 mg/dL — ABNORMAL HIGH (ref 70–99)
Potassium: 4 meq/L (ref 3.5–5.1)
Sodium: 140 meq/L (ref 135–145)
Total Bilirubin: 0.9 mg/dL (ref 0.2–1.2)
Total Protein: 7.4 g/dL (ref 6.0–8.3)

## 2023-08-23 LAB — LIPID PANEL
Cholesterol: 144 mg/dL (ref 0–200)
HDL: 55.6 mg/dL (ref >39.00)
LDL Cholesterol: 75 mg/dL (ref 0–99)
NonHDL: 88.32
Total CHOL/HDL Ratio: 3
Triglycerides: 66 mg/dL (ref 0.0–149.0)
VLDL: 13.2 mg/dL (ref 0.0–40.0)

## 2023-08-23 LAB — HEMOGLOBIN A1C: Hgb A1c MFr Bld: 7.8 % — ABNORMAL HIGH (ref 4.6–6.5)

## 2023-08-23 MED ORDER — TIZANIDINE HCL 4 MG PO TABS: 4.0000 mg | ORAL_TABLET | Freq: Four times a day (QID) | ORAL | 3 refills | Status: AC | PRN

## 2023-08-23 MED ORDER — PANTOPRAZOLE SODIUM 20 MG PO TBEC: 20.0000 mg | DELAYED_RELEASE_TABLET | Freq: Every day | ORAL | 3 refills | Status: DC

## 2023-08-23 MED ORDER — MIRTAZAPINE 45 MG PO TABS: ORAL_TABLET | ORAL | 0 refills | Status: DC

## 2023-08-23 MED ORDER — HYDROCHLOROTHIAZIDE 25 MG PO TABS: 25.0000 mg | ORAL_TABLET | Freq: Every day | ORAL | 3 refills | Status: DC

## 2023-08-23 MED ORDER — EMPAGLIFLOZIN 25 MG PO TABS: 25.0000 mg | ORAL_TABLET | Freq: Every day | ORAL | 3 refills | Status: DC

## 2023-08-23 MED ORDER — PRAVASTATIN SODIUM 10 MG PO TABS: 10.0000 mg | ORAL_TABLET | Freq: Every day | ORAL | 3 refills | Status: DC

## 2023-08-23 MED ORDER — IRBESARTAN 300 MG PO TABS: 300.0000 mg | ORAL_TABLET | Freq: Every day | ORAL | 3 refills | Status: DC

## 2023-08-23 MED ORDER — SITAGLIPTIN PHOSPHATE 100 MG PO TABS: 100.0000 mg | ORAL_TABLET | Freq: Every day | ORAL | 3 refills | Status: DC

## 2023-08-23 MED ORDER — METFORMIN HCL 1000 MG PO TABS: 1000.0000 mg | ORAL_TABLET | Freq: Two times a day (BID) | ORAL | 3 refills | Status: DC

## 2023-08-23 NOTE — Assessment & Plan Note (Signed)
 Flu shot yearly. Pneumonia complete. Shingrix complete. Tetanus due at pharmacy. Colonoscopy due 2029. Counseled about sun safety and mole surveillance. Counseled about the dangers of distracted driving. Given 10 year screening recommendations.

## 2023-08-23 NOTE — Assessment & Plan Note (Signed)
 BMI 38 and complicated by diabetes and hyperlipidemia and hypertension.

## 2023-08-23 NOTE — Assessment & Plan Note (Signed)
Checking CMP for stability.  

## 2023-08-23 NOTE — Assessment & Plan Note (Signed)
 BP at goal on hydrochlorothiazide  and irbesartan  and checking CMP and adjust as needed.

## 2023-08-23 NOTE — Assessment & Plan Note (Signed)
 Stable and using hydrocodone  for pain and on disability for same.

## 2023-08-23 NOTE — Progress Notes (Signed)
   Subjective:   Patient ID: Douglas Robinson, male    DOB: [birth year], 63 y.o.   MRN: [id number]  The patient is here for physical. Pertinent topics discussed:   PMH, [facility], social history reviewed and updated  Review of Systems  Constitutional: Negative.   HENT: Negative.    Eyes: Negative.   Respiratory:  Negative for cough, chest tightness and shortness of breath.   Cardiovascular:  Negative for chest pain, palpitations and leg swelling.  Gastrointestinal:  Negative for abdominal distention, abdominal pain, constipation, diarrhea, nausea and vomiting.  Musculoskeletal:  Positive for arthralgias.  Skin: Negative.   Neurological: Negative.   Psychiatric/Behavioral: Negative.      Objective:  Physical Exam Constitutional:      Appearance: He is well-developed.  HENT:     Head: Normocephalic and atraumatic.  Cardiovascular:     Rate and Rhythm: Normal rate and regular rhythm.  Pulmonary:     Effort: Pulmonary effort is normal. No respiratory distress.     Breath sounds: Normal breath sounds. No wheezing or rales.  Abdominal:     General: Bowel sounds are normal. There is no distension.     Palpations: Abdomen is soft.     Tenderness: There is no abdominal tenderness. There is no rebound.  Musculoskeletal:        General: Tenderness present.     Cervical back: Normal range of motion.  Skin:    General: Skin is warm and dry.  Neurological:     Mental Status: He is alert and oriented to person, place, and time.     Coordination: Coordination normal.     Vitals:   08/23/23 0803  BP: 120/80  Pulse: (!) 102  Temp: 98.7 F (37.1 C)  TempSrc: Oral  SpO2: 92%  Weight: 270 lb (122.5 kg)  Height: 5' 10 (1.778 m)    Assessment & Plan:

## 2023-08-23 NOTE — Assessment & Plan Note (Signed)
 Checking lipid panel and adjust as needed statin.

## 2023-08-23 NOTE — Assessment & Plan Note (Signed)
 Checking HgA1c and UACR and CMP and lipid panel and adjust as needed acarbose  and jardiance  and metformin  and januvia . On ARB and statin

## 2023-08-26 ENCOUNTER — Ambulatory Visit: Payer: Self-pay | Admitting: Internal Medicine

## 2023-08-30 NOTE — Progress Notes (Signed)
 Results were stable and there were no questions or concerns

## 2023-09-19 ENCOUNTER — Telehealth: Payer: Self-pay

## 2023-09-19 NOTE — Telephone Encounter (Signed)
 Called patient wife back and informed about the results of husbands A1C

## 2023-09-19 NOTE — Telephone Encounter (Signed)
 Copied from CRM #[id number]. Topic: Clinical - Lab/Test Results >> Sep 19, 2023  8:13 AM Burnard DEL wrote: Reason for CRM: Patients wife called to get  her husband A1C numbers for labs that he had drawn on 08/23/2023.

## 2023-09-25 ENCOUNTER — Other Ambulatory Visit: Payer: Self-pay | Admitting: Internal Medicine

## 2023-09-26 ENCOUNTER — Other Ambulatory Visit: Payer: Self-pay | Admitting: Internal Medicine

## 2023-09-27 ENCOUNTER — Ambulatory Visit (INDEPENDENT_AMBULATORY_CARE_PROVIDER_SITE_OTHER): Payer: Medicare HMO

## 2023-09-27 VITALS — Ht 70.0 in | Wt 270.0 lb

## 2023-09-27 DIAGNOSIS — Z0000 Encounter for general adult medical examination without abnormal findings: Secondary | ICD-10-CM

## 2023-09-27 NOTE — Patient Instructions (Signed)
 [name],  Thank you for taking the time for your Medicare Wellness Visit. I appreciate your continued commitment to your health goals. Please review the care plan we discussed, and feel free to reach out if I can assist you further.  Medicare recommends these wellness visits once per year to help you and your care team stay ahead of potential health issues. These visits are designed to focus on prevention, allowing your provider to concentrate on managing your acute and chronic conditions during your regular appointments.  Please note that Annual Wellness Visits do not include a physical exam. Some assessments may be limited, especially if the visit was conducted virtually. If needed, we may recommend a separate in-person follow-up with your provider.  Ongoing Care Seeing your primary care provider every 3 to 6 months helps us  monitor your health and provide consistent, personalized care. You are due for a diabetic eye exam.  You are also due for a Flu vaccine and a pneumonia vaccine, which you can get at the pharmacy.  Keep up the good work.  Remember to ask about the Healthcare Directive forms during your next office visit.  Also remember to inform Dr. Rollene of the name of your new eye doctor that you are going to in Jan.  Referrals If a referral was made during today's visit and you haven't received any updates within two weeks, please contact the referred provider directly to check on the status.  Recommended Screenings:  Health Maintenance  Topic Date Due   HIV Screening  Never done   Pneumococcal Vaccine for age over 42 (2 of 2 - PCV) 09/12/2012   Eye exam for diabetics  10/27/2019   Flu Shot  08/16/2023   COVID-19 Vaccine (5 - 2025-26 season) 09/16/2023   Zoster (Shingles) Vaccine (2 of 2) 09/17/2023   Medicare Annual Wellness Visit  09/25/2023   Hemoglobin A1C  02/23/2024   Yearly kidney function blood test for diabetes  08/22/2024   Yearly kidney health urinalysis for  diabetes  08/22/2024   Complete foot exam   08/22/2024   Colon Cancer Screening  08/03/2027   DTaP/Tdap/Td vaccine (2 - Td or Tdap) 11/07/2027   Hepatitis C Screening  Completed   Hepatitis B Vaccine  Aged Out   HPV Vaccine  Aged Out   Meningitis B Vaccine  Aged Out       09/27/2023    8:30 AM  Advanced Directives  Does Patient Have a Medical Advance Directive? No   Advance Care Planning is important because it: Ensures you receive medical care that aligns with your values, goals, and preferences. Provides guidance to your family and loved ones, reducing the emotional burden of decision-making during critical moments.  Vision: Annual vision screenings are recommended for early detection of glaucoma, cataracts, and diabetic retinopathy. These exams can also reveal signs of chronic conditions such as diabetes and high blood pressure.  Dental: Annual dental screenings help detect early signs of oral cancer, gum disease, and other conditions linked to overall health, including heart disease and diabetes.  Please see the attached documents for additional preventive care recommendations.

## 2023-09-27 NOTE — Progress Notes (Signed)
 Subjective:   Douglas Robinson is a 63 y.o. who presents for a Medicare Wellness preventive visit.  As a reminder, Annual Wellness Visits don't include a physical exam, and some assessments may be limited, especially if this visit is performed virtually. We may recommend an in-person follow-up visit with your provider if needed.  Visit Complete: Virtual I connected with  Douglas Robinson on 09/27/23 by a audio enabled telemedicine application and verified that I am speaking with the correct person using two identifiers.  Patient Location: Home  Provider Location: Office/Clinic  I discussed the limitations of evaluation and management by telemedicine. The patient expressed understanding and agreed to proceed.  Vital Signs: Because this visit was a virtual/telehealth visit, some criteria may be missing or patient reported. Any vitals not documented were not able to be obtained and vitals that have been documented are patient reported.  VideoDeclined- This patient declined Librarian, academic. Therefore the visit was completed with audio only.  Persons Participating in Visit: Patient.  AWV Questionnaire: No: Patient Medicare AWV questionnaire was not completed prior to this visit.  Cardiac Risk Factors include: advanced age (>31men, >23 women);hypertension;diabetes mellitus;dyslipidemia;male gender;obesity (BMI >30kg/m2)     Objective:    Today's Vitals   09/27/23 0817 09/27/23 0818  Weight: 270 lb (122.5 kg)   Height: 5' 10 (1.778 m)   PainSc:  8    Body mass index is 38.74 kg/m.     09/27/2023    8:30 AM 09/25/2022    9:03 AM 11/22/2021    8:52 AM 11/21/2020   11:18 AM 08/12/2019    9:31 AM 08/06/2018    9:20 AM 12/27/2014    7:08 AM  Advanced Directives  Does Patient Have a Medical Advance Directive? No No No No No No No   Would patient like information on creating a medical advance directive?  No - Patient declined No - Patient declined No -  Patient declined Yes (MAU/Ambulatory/Procedural Areas - Information given) Yes (ED - Information included in AVS)       Data saved with a previous flowsheet row definition    Current Medications (verified) Outpatient Encounter Medications as of 09/27/2023  Medication Sig   acarbose  (PRECOSE ) 50 MG tablet TAKE 1 TABLET THREE TIMES DAILY WITH MEALS   ACCU-CHEK AVIVA PLUS test strip TEST BLOOD SUGAR TWICE DAILY   Accu-Chek Softclix Lancets lancets Use to check glucose as needed E11.9   albuterol  (VENTOLIN  HFA) 108 (90 Base) MCG/ACT inhaler INHALE 1 TO 2 PUFFS EVERY 6 HOURS AS NEEDED FOR WHEEZING OR FOR SHORTNESS OF BREATH   aspirin 81 MG tablet Take 81 mg by mouth daily.   empagliflozin  (JARDIANCE ) 25 MG TABS tablet TAKE 1 TABLET EVERY DAY   glipiZIDE  (GLUCOTROL  XL) 10 MG 24 hr tablet TAKE 1 TABLET EVERY DAY WITH BREAKFAST   hydrochlorothiazide  (HYDRODIURIL ) 25 MG tablet TAKE 1 TABLET EVERY DAY   HYDROcodone -acetaminophen  (NORCO) 10-325 MG tablet Take 1 tablet by mouth 3 (three) times daily as needed.   irbesartan  (AVAPRO ) 300 MG tablet Take 1 tablet (300 mg total) by mouth daily.   metFORMIN  (GLUCOPHAGE ) 1000 MG tablet Take 1 tablet (1,000 mg total) by mouth 2 (two) times daily with a meal.   mirtazapine  (REMERON ) 45 MG tablet TAKE 1 TABLET AT BEDTIME. DISCONTINUE ANY OTHER DOSES OF MIRTAZAPINE .   Multiple Vitamin (MULTIVITAMIN) tablet Take 1 tablet by mouth daily.   ofloxacin (OCUFLOX) 0.3 % ophthalmic solution    pantoprazole  (PROTONIX ) 20 MG tablet  Take 1 tablet (20 mg total) by mouth daily.   pravastatin  (PRAVACHOL ) 10 MG tablet Take 1 tablet (10 mg total) by mouth daily.   PRODIGY TWIST TOP LANCETS 28G MISC Use one lancet at each glucose check, as needed.   sitaGLIPtin  (JANUVIA ) 100 MG tablet TAKE 1 TABLET EVERY DAY   tiZANidine  (ZANAFLEX ) 4 MG tablet Take 1 tablet (4 mg total) by mouth every 6 (six) hours as needed for muscle spasms.   traZODone  (DESYREL ) 100 MG tablet Take 1 tablet by  mouth at bedtime.   vitamin B-12 (CYANOCOBALAMIN) 1000 MCG tablet Take 1,000 mcg by mouth daily.   No facility-administered encounter medications on file as of 09/27/2023.    Allergies (verified) Naproxen   History: Past Medical History:  Diagnosis Date   Anxiety    Cataract    COPD (chronic obstructive pulmonary disease) (HCC)    Diabetes (HCC)    GERD (gastroesophageal reflux disease)    HTN (hypertension)    Hyperlipidemia    OSA on CPAP    Sleep apnea    Past Surgical History:  Procedure Laterality Date   CARPAL TUNNEL RELEASE Right    COLONOSCOPY  2013   HERNIA REPAIR     POLYPECTOMY  2013   4 polyps-TA   REPAIR ANKLE LIGAMENT  1995   Sometime before 1996   Family History  Problem Relation Age of Onset   Cancer - Prostate Father    Colon polyps Father    Colon cancer Neg Hx    Rectal cancer Neg Hx    Stomach cancer Neg Hx    Esophageal cancer Neg Hx    Social History   Socioeconomic History   Marital status: Married    Spouse name: Arland   Number of children: 0   Years of education: 12th   Highest education level: Not on file  Occupational History   Occupation: disabilty  Tobacco Use   Smoking status: Never    Passive exposure: Never   Smokeless tobacco: Never  Vaping Use   Vaping status: Never Used  Substance and Sexual Activity   Alcohol use: No    Alcohol/week: 0.0 standard drinks of alcohol   Drug use: No   Sexual activity: Yes  Other Topics Concern   Not on file  Social History Narrative   Lives with wife/2025   Social Drivers of Health   Financial Resource Strain: High Risk (09/27/2023)   Overall Financial Resource Strain (CARDIA)    Difficulty of Paying Living Expenses: Very hard  Food Insecurity: Food Insecurity Present (09/27/2023)   Hunger Vital Sign    Worried About Running Out of Food in the Last Year: Never true    Ran Out of Food in the Last Year: Sometimes true  Transportation Needs: No Transportation Needs (09/27/2023)    PRAPARE - Administrator, Civil Service (Medical): No    Lack of Transportation (Non-Medical): No  Physical Activity: Insufficiently Active (09/27/2023)   Exercise Vital Sign    Days of Exercise per Week: 2 days    Minutes of Exercise per Session: 60 min  Stress: No Stress Concern Present (09/27/2023)   [employer] of Occupational Health - Occupational Stress Questionnaire    Feeling of Stress: Only a little  Social Connections: Moderately Isolated (09/27/2023)   Social Connection and Isolation Panel    Frequency of Communication with Friends and Family: More than three times a week    Frequency of Social Gatherings with Friends and Family:  Never    Attends Religious Services: Never    Active Member of Clubs or Organizations: No    Attends Banker Meetings: Never    Marital Status: Married    Tobacco Counseling Counseling given: Not Answered    Clinical Intake:  Pre-visit preparation completed: Yes  Pain : 0-10 Pain Score: 8  Pain Type: Chronic pain Pain Location: Foot Pain Orientation: Right Pain Descriptors / Indicators: Aching, Discomfort Pain Onset: More than a month ago Pain Frequency: Constant Pain Relieving Factors: Hydroncodone Effect of Pain on Daily Activities: walking  Pain Relieving Factors: Hydroncodone  BMI - recorded: 38.74 Nutritional Status: BMI > 30  Obese Nutritional Risks: None Diabetes: Yes CBG done?: No Did pt. bring in CBG monitor from home?: No  Lab Results  Component Value Date   HGBA1C 7.8 (H) 08/23/2023   HGBA1C 7.5 05/10/2023   HGBA1C 7.9 01/24/2023     How often do you need to have someone help you when you read instructions, pamphlets, or other written materials from your doctor or pharmacy?: 1 - Never  Interpreter Needed?: No  Information entered by :: Sumiko Ceasar, RMA   Activities of Daily Living     09/27/2023    8:18 AM  In your present state of health, do you have any difficulty  performing the following activities:  Hearing? 0  Vision? 0  Difficulty concentrating or making decisions? 0  Walking or climbing stairs? 0  Dressing or bathing? 0  Doing errands, shopping? 0  Preparing Food and eating ? N  Using the Toilet? N  In the past six months, have you accidently leaked urine? N  Do you have problems with loss of bowel control? N  Managing your Medications? N  Managing your Finances? N  Housekeeping or managing your Housekeeping? N    Patient Care Team: Rollene Almarie LABOR, MD as PCP - General (Internal Medicine) Teressa Toribio SQUIBB, MD (Inactive) as Attending Physician (Gastroenterology) Roz Anes, MD as Consulting Physician (Ophthalmology) Grossman, Janelle, NP as Nurse Practitioner (Nurse Practitioner) Rozella Toribio BROCKS, [facility] (Inactive) as Pharmacist (Pharmacist)  I have updated your Care Teams any recent Medical Services you may have received from other providers in the past year.     Assessment:   This is a routine wellness examination for Douglas Robinson.  Hearing/Vision screen Hearing Screening - Comments:: Denies hearing difficulties   Vision Screening - Comments:: Denies vision issues.    Goals Addressed               This Visit's Progress     Patient Stated (pt-stated)   On track     I'm ready to go back to the gym        Depression Screen     09/27/2023    8:34 AM 08/23/2023    8:05 AM 09/25/2022    9:10 AM 12/05/2021    8:28 AM 11/22/2021    9:33 AM 11/21/2020   11:20 AM 11/21/2020   11:16 AM  PHQ 2/9 Scores  PHQ - 2 Score 1 0 0 0 2 0 0  PHQ- 9 Score 1  0 0 5      Fall Risk     09/27/2023    8:31 AM 08/23/2023    8:04 AM 05/10/2023    8:17 AM 10/09/2022    8:38 AM 09/25/2022    9:03 AM  Fall Risk   Falls in the past year? 0 0 0 0 0  Number falls in past yr:  0 0 0 0 0  Injury with Fall? 0 0 0 0 0  Risk for fall due to :     No Fall Risks  Follow up Falls evaluation completed;Falls prevention discussed Falls evaluation  completed Falls evaluation completed Falls evaluation completed Falls prevention discussed;Falls evaluation completed    MEDICARE RISK AT HOME:  Medicare Risk at Home Any stairs in or around the home?: Yes (3 steps outside) If so, are there any without handrails?: No Home free of loose throw rugs in walkways, pet beds, electrical cords, etc?: Yes Adequate lighting in your home to reduce risk of falls?: Yes Life alert?: No Use of a cane, walker or w/c?: No Grab bars in the bathroom?: Yes Shower chair or bench in shower?: Yes Elevated toilet seat or a handicapped toilet?: Yes  TIMED UP AND GO:  Was the test performed?  No  Cognitive Function: Declined/Normal: No cognitive concerns noted by patient or family. Patient alert, oriented, able to answer questions appropriately and recall recent events. No signs of memory loss or confusion.        09/25/2022    9:05 AM 11/22/2021    9:00 AM  6CIT Screen  What Year? 0 points 0 points  What month? 0 points 0 points  What time? 0 points 0 points  Count back from 20 0 points 0 points  Months in reverse 4 points 2 points  Repeat phrase 0 points 2 points  Total Score 4 points 4 points    Immunizations Immunization History  Administered Date(s) Administered   Fluad Quad(high Dose 65+) 10/10/2018   Influenza, Seasonal, Injecte, Preservative Fre 10/09/2022   Influenza,inj,Quad PF,6+ Mos 09/25/2017, 09/23/2018   Influenza-Unspecified 09/23/2017, 11/16/2019, 11/01/2020   Moderna Sars-Covid-2 Vaccination 05/19/2019, 06/16/2019, 01/25/2020   PFIZER SARS-COV-2 Pediatric Vaccination 5-71yrs 07/28/2020   Pfizer Covid-19 Vaccine Bivalent Booster 93yrs & up 11/02/2020   Pneumococcal Polysaccharide-23 09/13/2011   Tdap 11/06/2017   Zoster Recombinant(Shingrix) 07/23/2023, 09/23/2023    Screening Tests Health Maintenance  Topic Date Due   HIV Screening  Never done   Pneumococcal Vaccine: 50+ Years (2 of 2 - PCV) 09/12/2012   OPHTHALMOLOGY  EXAM  10/27/2019   Influenza Vaccine  08/16/2023   COVID-19 Vaccine (5 - 2025-26 season) 09/16/2023   HEMOGLOBIN A1C  02/23/2024   Diabetic kidney evaluation - eGFR measurement  08/22/2024   Diabetic kidney evaluation - Urine ACR  08/22/2024   FOOT EXAM  08/22/2024   Medicare Annual Wellness (AWV)  09/26/2024   Colonoscopy  08/03/2027   DTaP/Tdap/Td (2 - Td or Tdap) 11/07/2027   Hepatitis C Screening  Completed   Zoster Vaccines- Shingrix  Completed   Hepatitis B Vaccines 19-59 Average Risk  Aged Out   HPV VACCINES  Aged Out   Meningococcal B Vaccine  Aged Out    Health Maintenance Items Addressed: See Nurse Notes at the end of this note  Additional Screening:  Vision Screening: Recommended annual ophthalmology exams for early detection of glaucoma and other disorders of the eye. Is the patient up to date with their annual eye exam?  No  Who is the provider or what is the name of the office in which the patient attends annual eye exams? Patient did not know the name of provider.  He will call back to let us  know.  Dental Screening: Recommended annual dental exams for proper oral hygiene  Community Resource Referral / Chronic Care Management: CRR required this visit?  No   CCM required this  visit?  No   Plan:    I have personally reviewed and noted the following in the patient's chart:   Medical and social history Use of alcohol, tobacco or illicit drugs  Current medications and supplements including opioid prescriptions. Patient is currently taking opioid prescriptions. Information provided to patient regarding non-opioid alternatives. Patient advised to discuss non-opioid treatment plan with their provider. Functional ability and status Nutritional status Physical activity Advanced directives List of other physicians Hospitalizations, surgeries, and ER visits in previous 12 months Vitals Screenings to include cognitive, depression, and falls Referrals and  appointments  In addition, I have reviewed and discussed with patient certain preventive protocols, quality metrics, and best practice recommendations. A written personalized care plan for preventive services as well as general preventive health recommendations were provided to patient.   Trachelle Low L Brooklynne Pereida, CMA   09/27/2023   After Visit Summary: (Mail) Due to this being a telephonic visit, the after visit summary with patients personalized plan was offered to patient via mail   Notes: Patient is due for a flu vaccine and a pneumonia vaccine.  He stated that he has an eye appointment coming up in January with a new eye doctor and will call office back with the name.  He had no other concerns to address today.

## 2023-10-05 ENCOUNTER — Other Ambulatory Visit: Payer: Self-pay | Admitting: Internal Medicine

## 2023-10-14 ENCOUNTER — Telehealth: Payer: Self-pay

## 2023-10-14 NOTE — Telephone Encounter (Signed)
 He just had this done in August not needed now

## 2023-10-14 NOTE — Telephone Encounter (Signed)
 Copied from CRM #[id number]. Topic: Clinical - Request for Lab/Test Order >> Oct 14, 2023  9:43 AM Douglas Robinson wrote: Reason for CRM: Patient would like to know if he could have kidney urine and blood test labs placed.Patients [employer] insurance would like for these test to be done?

## 2023-11-21 DIAGNOSIS — M19072 Primary osteoarthritis, left ankle and foot: Secondary | ICD-10-CM | POA: Diagnosis not present

## 2023-11-21 DIAGNOSIS — M67961 Unspecified disorder of synovium and tendon, right lower leg: Secondary | ICD-10-CM | POA: Diagnosis not present

## 2023-11-21 DIAGNOSIS — Z79899 Other long term (current) drug therapy: Secondary | ICD-10-CM | POA: Diagnosis not present

## 2023-12-03 ENCOUNTER — Ambulatory Visit: Admitting: Internal Medicine

## 2023-12-03 ENCOUNTER — Encounter: Payer: Self-pay | Admitting: Internal Medicine

## 2023-12-03 VITALS — BP 118/72 | HR 78 | Temp 98.5°F | Ht 70.0 in | Wt 266.6 lb

## 2023-12-03 DIAGNOSIS — Z23 Encounter for immunization: Secondary | ICD-10-CM | POA: Diagnosis not present

## 2023-12-03 DIAGNOSIS — E1169 Type 2 diabetes mellitus with other specified complication: Secondary | ICD-10-CM | POA: Diagnosis not present

## 2023-12-03 DIAGNOSIS — M79672 Pain in left foot: Secondary | ICD-10-CM | POA: Diagnosis not present

## 2023-12-03 DIAGNOSIS — M79671 Pain in right foot: Secondary | ICD-10-CM | POA: Diagnosis not present

## 2023-12-03 DIAGNOSIS — E785 Hyperlipidemia, unspecified: Secondary | ICD-10-CM | POA: Diagnosis not present

## 2023-12-03 LAB — POCT GLYCOSYLATED HEMOGLOBIN (HGB A1C): Hemoglobin A1C: 7.4 % — AB (ref 4.0–5.6)

## 2023-12-03 NOTE — Assessment & Plan Note (Signed)
 Glycemic control has improved with Hemoglobin A1c reduced to 7.4%. Continue current diabetes management plan. Encourage dietary modifications and increased physical activity.

## 2023-12-03 NOTE — Progress Notes (Signed)
   Subjective:   Patient ID: Douglas Robinson, male    DOB: Apr 09, 1960, 63 y.o.   MRN: [id number]  Discussed the use of AI scribe software for clinical note transcription with the patient, who gave verbal consent to proceed.  History of Present Illness Douglas Robinson is a 63 year old male with diabetes who presents for routine follow-up.  His HbA1c level has decreased from 7.8% to 7.4%, which he attributes to dietary changes and increased physical activity.  He continues to experience pain in his legs and feet, which remains unchanged.  No new chest pain, tightness, pressure, or breathing problems. No episodes of hypoglycemia, such as feeling shaky or dizzy.  Review of Systems  Constitutional: Negative.   HENT: Negative.    Eyes: Negative.   Respiratory:  Negative for cough, chest tightness and shortness of breath.   Cardiovascular:  Negative for chest pain, palpitations and leg swelling.  Gastrointestinal:  Negative for abdominal distention, abdominal pain, constipation, diarrhea, nausea and vomiting.  Musculoskeletal:  Positive for arthralgias and back pain.  Skin: Negative.   Neurological: Negative.   Psychiatric/Behavioral: Negative.      Objective:  Physical Exam Constitutional:      Appearance: Normal appearance. He is obese.  HENT:     Head: Normocephalic.  Cardiovascular:     Rate and Rhythm: Normal rate and regular rhythm.  Pulmonary:     Effort: Pulmonary effort is normal.  Musculoskeletal:        General: Normal range of motion.  Skin:    General: Skin is warm and dry.  Neurological:     General: No focal deficit present.     Mental Status: He is alert and oriented to person, place, and time.     Vitals:   12/03/23 0813  BP: 118/72  Pulse: 78  Temp: 98.5 F (36.9 C)  TempSrc: Oral  SpO2: 94%  Weight: 266 lb 9.6 oz (120.9 kg)  Height: 5' 10 (1.778 m)   Prevnar 20 given at visit Assessment and Plan Assessment & Plan Type 2 diabetes mellitus    Glycemic control has improved with Hemoglobin A1c reduced to 7.4%. Continue current diabetes management plan. Encourage dietary modifications and increased physical activity.  Chronic pain in legs and feet   Chronic pain remains unchanged.

## 2023-12-03 NOTE — Assessment & Plan Note (Signed)
 Chronic pain remains unchanged.

## 2023-12-03 NOTE — Patient Instructions (Signed)
 You can get the RSV vaccine at the pharmacy.

## 2023-12-25 ENCOUNTER — Other Ambulatory Visit: Payer: Self-pay | Admitting: Internal Medicine

## 2024-02-19 ENCOUNTER — Other Ambulatory Visit: Payer: Self-pay | Admitting: Internal Medicine

## 2024-03-06 ENCOUNTER — Ambulatory Visit: Admitting: Internal Medicine

## 2024-10-01 ENCOUNTER — Ambulatory Visit
